# Patient Record
Sex: Female | Born: 1960 | Race: White | Hispanic: No | Marital: Married | Smoking: Never smoker
Health system: Southern US, Community
[De-identification: ages and names within clinical notes are randomized; demographics above are authoritative.]

## PROBLEM LIST (undated history)

## (undated) DIAGNOSIS — Z973 Presence of spectacles and contact lenses: Secondary | ICD-10-CM

## (undated) DIAGNOSIS — I471 Supraventricular tachycardia, unspecified: Secondary | ICD-10-CM

## (undated) DIAGNOSIS — D649 Anemia, unspecified: Secondary | ICD-10-CM

## (undated) DIAGNOSIS — H4312 Vitreous hemorrhage, left eye: Secondary | ICD-10-CM

## (undated) DIAGNOSIS — C801 Malignant (primary) neoplasm, unspecified: Secondary | ICD-10-CM

## (undated) HISTORY — PX: ABLATION: SHX5711

## (undated) HISTORY — PX: CHOLECYSTECTOMY: SHX55

## (undated) HISTORY — PX: EYE SURGERY: SHX253

## (undated) HISTORY — PX: COLONOSCOPY W/ BIOPSIES AND POLYPECTOMY: SHX1376

## (undated) HISTORY — PX: CARDIAC CATHETERIZATION: SHX172

---

## 1983-04-08 DIAGNOSIS — C801 Malignant (primary) neoplasm, unspecified: Secondary | ICD-10-CM

## 1983-04-08 DIAGNOSIS — C439 Malignant melanoma of skin, unspecified: Secondary | ICD-10-CM

## 1983-04-08 HISTORY — DX: Malignant (primary) neoplasm, unspecified: C80.1

## 1983-04-08 HISTORY — DX: Malignant melanoma of skin, unspecified: C43.9

## 2000-03-07 HISTORY — PX: BREAST CYST ASPIRATION: SHX578

## 2003-07-21 ENCOUNTER — Other Ambulatory Visit: Payer: Self-pay

## 2004-04-04 ENCOUNTER — Ambulatory Visit: Payer: Self-pay | Admitting: Obstetrics and Gynecology

## 2005-04-18 ENCOUNTER — Ambulatory Visit: Payer: Self-pay | Admitting: Obstetrics and Gynecology

## 2005-08-27 ENCOUNTER — Ambulatory Visit: Payer: Self-pay | Admitting: Unknown Physician Specialty

## 2006-04-20 ENCOUNTER — Ambulatory Visit: Payer: Self-pay | Admitting: Obstetrics and Gynecology

## 2007-01-01 ENCOUNTER — Ambulatory Visit: Payer: Self-pay | Admitting: Dermatology

## 2007-06-17 ENCOUNTER — Ambulatory Visit: Payer: Self-pay | Admitting: Obstetrics and Gynecology

## 2007-10-06 DIAGNOSIS — Z86018 Personal history of other benign neoplasm: Secondary | ICD-10-CM

## 2007-10-06 HISTORY — DX: Personal history of other benign neoplasm: Z86.018

## 2007-12-08 ENCOUNTER — Ambulatory Visit: Payer: Self-pay | Admitting: Obstetrics and Gynecology

## 2008-09-29 ENCOUNTER — Ambulatory Visit: Payer: Self-pay | Admitting: Obstetrics and Gynecology

## 2008-10-05 ENCOUNTER — Ambulatory Visit: Payer: Self-pay | Admitting: Dermatology

## 2008-10-31 ENCOUNTER — Ambulatory Visit: Payer: Self-pay | Admitting: Unknown Physician Specialty

## 2009-10-02 ENCOUNTER — Ambulatory Visit: Payer: Self-pay | Admitting: Obstetrics and Gynecology

## 2010-10-08 ENCOUNTER — Ambulatory Visit: Payer: Self-pay | Admitting: Obstetrics and Gynecology

## 2010-10-10 ENCOUNTER — Ambulatory Visit: Payer: Self-pay | Admitting: Obstetrics and Gynecology

## 2011-05-20 ENCOUNTER — Ambulatory Visit: Payer: Self-pay | Admitting: Internal Medicine

## 2011-11-14 ENCOUNTER — Ambulatory Visit: Payer: Self-pay | Admitting: Obstetrics and Gynecology

## 2011-11-18 ENCOUNTER — Ambulatory Visit: Payer: Self-pay | Admitting: Obstetrics and Gynecology

## 2011-12-11 ENCOUNTER — Ambulatory Visit: Payer: Self-pay | Admitting: Surgery

## 2011-12-11 HISTORY — PX: BREAST BIOPSY: SHX20

## 2011-12-12 LAB — PATHOLOGY REPORT

## 2012-06-10 ENCOUNTER — Ambulatory Visit: Payer: Self-pay | Admitting: Surgery

## 2012-12-15 ENCOUNTER — Ambulatory Visit: Payer: Self-pay | Admitting: Obstetrics and Gynecology

## 2013-12-19 ENCOUNTER — Ambulatory Visit: Payer: Self-pay | Admitting: Obstetrics and Gynecology

## 2014-11-15 ENCOUNTER — Other Ambulatory Visit: Payer: Self-pay | Admitting: Internal Medicine

## 2014-11-15 ENCOUNTER — Other Ambulatory Visit: Payer: Self-pay | Admitting: Obstetrics and Gynecology

## 2014-11-15 DIAGNOSIS — Z1231 Encounter for screening mammogram for malignant neoplasm of breast: Secondary | ICD-10-CM

## 2015-01-05 ENCOUNTER — Ambulatory Visit
Admission: RE | Admit: 2015-01-05 | Discharge: 2015-01-05 | Disposition: A | Discharge status: Home / Self Care | Payer: BLUE CROSS/BLUE SHIELD | Source: Ambulatory Visit | Attending: Obstetrics and Gynecology | Admitting: Obstetrics and Gynecology

## 2015-01-05 DIAGNOSIS — Z1231 Encounter for screening mammogram for malignant neoplasm of breast: Secondary | ICD-10-CM | POA: Insufficient documentation

## 2015-01-05 HISTORY — DX: Malignant (primary) neoplasm, unspecified: C80.1

## 2015-05-04 ENCOUNTER — Encounter (INDEPENDENT_AMBULATORY_CARE_PROVIDER_SITE_OTHER): Payer: Self-pay | Admitting: Ophthalmology

## 2015-05-11 ENCOUNTER — Encounter (INDEPENDENT_AMBULATORY_CARE_PROVIDER_SITE_OTHER): Payer: BLUE CROSS/BLUE SHIELD | Admitting: Ophthalmology

## 2015-05-11 DIAGNOSIS — H35033 Hypertensive retinopathy, bilateral: Secondary | ICD-10-CM | POA: Diagnosis not present

## 2015-05-11 DIAGNOSIS — H43813 Vitreous degeneration, bilateral: Secondary | ICD-10-CM

## 2015-05-11 DIAGNOSIS — I10 Essential (primary) hypertension: Secondary | ICD-10-CM

## 2015-05-11 DIAGNOSIS — H2512 Age-related nuclear cataract, left eye: Secondary | ICD-10-CM

## 2015-11-28 ENCOUNTER — Other Ambulatory Visit: Payer: Self-pay | Admitting: Obstetrics and Gynecology

## 2015-11-28 DIAGNOSIS — Z1231 Encounter for screening mammogram for malignant neoplasm of breast: Secondary | ICD-10-CM

## 2016-01-08 ENCOUNTER — Ambulatory Visit
Admission: RE | Admit: 2016-01-08 | Discharge: 2016-01-08 | Disposition: A | Discharge status: Home / Self Care | Payer: BLUE CROSS/BLUE SHIELD | Source: Ambulatory Visit | Attending: Obstetrics and Gynecology | Admitting: Obstetrics and Gynecology

## 2016-01-08 DIAGNOSIS — Z1231 Encounter for screening mammogram for malignant neoplasm of breast: Secondary | ICD-10-CM | POA: Diagnosis present

## 2016-03-26 ENCOUNTER — Encounter (INDEPENDENT_AMBULATORY_CARE_PROVIDER_SITE_OTHER): Payer: BLUE CROSS/BLUE SHIELD | Admitting: Ophthalmology

## 2016-03-26 DIAGNOSIS — H33021 Retinal detachment with multiple breaks, right eye: Secondary | ICD-10-CM

## 2016-03-26 DIAGNOSIS — I10 Essential (primary) hypertension: Secondary | ICD-10-CM

## 2016-03-26 DIAGNOSIS — H2512 Age-related nuclear cataract, left eye: Secondary | ICD-10-CM | POA: Diagnosis not present

## 2016-03-26 DIAGNOSIS — H43813 Vitreous degeneration, bilateral: Secondary | ICD-10-CM | POA: Diagnosis not present

## 2016-03-26 DIAGNOSIS — H35033 Hypertensive retinopathy, bilateral: Secondary | ICD-10-CM

## 2016-04-10 ENCOUNTER — Encounter (INDEPENDENT_AMBULATORY_CARE_PROVIDER_SITE_OTHER): Payer: BLUE CROSS/BLUE SHIELD | Admitting: Ophthalmology

## 2016-04-10 DIAGNOSIS — H33301 Unspecified retinal break, right eye: Secondary | ICD-10-CM

## 2016-08-11 ENCOUNTER — Ambulatory Visit (INDEPENDENT_AMBULATORY_CARE_PROVIDER_SITE_OTHER): Payer: BLUE CROSS/BLUE SHIELD | Admitting: Ophthalmology

## 2016-08-11 DIAGNOSIS — H33301 Unspecified retinal break, right eye: Secondary | ICD-10-CM

## 2016-08-11 DIAGNOSIS — I10 Essential (primary) hypertension: Secondary | ICD-10-CM | POA: Diagnosis not present

## 2016-08-11 DIAGNOSIS — H43813 Vitreous degeneration, bilateral: Secondary | ICD-10-CM | POA: Diagnosis not present

## 2016-08-11 DIAGNOSIS — H35033 Hypertensive retinopathy, bilateral: Secondary | ICD-10-CM

## 2016-08-11 DIAGNOSIS — H2513 Age-related nuclear cataract, bilateral: Secondary | ICD-10-CM | POA: Diagnosis not present

## 2016-11-28 ENCOUNTER — Other Ambulatory Visit: Payer: Self-pay | Admitting: Obstetrics and Gynecology

## 2017-01-14 ENCOUNTER — Other Ambulatory Visit: Payer: Self-pay | Admitting: Obstetrics and Gynecology

## 2017-01-14 DIAGNOSIS — Z1231 Encounter for screening mammogram for malignant neoplasm of breast: Secondary | ICD-10-CM

## 2017-02-05 ENCOUNTER — Ambulatory Visit
Admission: RE | Admit: 2017-02-05 | Discharge: 2017-02-05 | Disposition: A | Discharge status: Home / Self Care | Payer: BLUE CROSS/BLUE SHIELD | Source: Ambulatory Visit | Attending: Obstetrics and Gynecology | Admitting: Obstetrics and Gynecology

## 2017-02-05 DIAGNOSIS — Z1231 Encounter for screening mammogram for malignant neoplasm of breast: Secondary | ICD-10-CM | POA: Insufficient documentation

## 2017-09-18 ENCOUNTER — Other Ambulatory Visit: Payer: Self-pay

## 2017-09-18 ENCOUNTER — Emergency Department
Admission: EM | Admit: 2017-09-18 | Discharge: 2017-09-19 | Disposition: A | Discharge status: Home / Self Care | Payer: BLUE CROSS/BLUE SHIELD | Attending: Emergency Medicine | Admitting: Emergency Medicine

## 2017-09-18 DIAGNOSIS — K2100 Gastro-esophageal reflux disease with esophagitis, without bleeding: Secondary | ICD-10-CM

## 2017-09-18 DIAGNOSIS — R110 Nausea: Secondary | ICD-10-CM | POA: Diagnosis present

## 2017-09-18 LAB — URINALYSIS, COMPLETE (UACMP) WITH MICROSCOPIC
Bacteria, UA: NONE SEEN
Bilirubin Urine: NEGATIVE
GLUCOSE, UA: NEGATIVE mg/dL
Hgb urine dipstick: NEGATIVE
Ketones, ur: NEGATIVE mg/dL
Nitrite: NEGATIVE
PH: 5 (ref 5.0–8.0)
Protein, ur: NEGATIVE mg/dL
Specific Gravity, Urine: 1.014 (ref 1.005–1.030)

## 2017-09-18 LAB — CBC
HCT: 39.3 % (ref 35.0–47.0)
Hemoglobin: 13.5 g/dL (ref 12.0–16.0)
MCH: 31.1 pg (ref 26.0–34.0)
MCHC: 34.4 g/dL (ref 32.0–36.0)
MCV: 90.4 fL (ref 80.0–100.0)
PLATELETS: 185 10*3/uL (ref 150–440)
RBC: 4.34 MIL/uL (ref 3.80–5.20)
RDW: 13 % (ref 11.5–14.5)
WBC: 9.7 10*3/uL (ref 3.6–11.0)

## 2017-09-18 LAB — COMPREHENSIVE METABOLIC PANEL
ALK PHOS: 63 U/L (ref 38–126)
ALT: 15 U/L (ref 14–54)
AST: 25 U/L (ref 15–41)
Albumin: 3.9 g/dL (ref 3.5–5.0)
Anion gap: 8 (ref 5–15)
BUN: 16 mg/dL (ref 6–20)
CALCIUM: 8.6 mg/dL — AB (ref 8.9–10.3)
CHLORIDE: 102 mmol/L (ref 101–111)
CO2: 26 mmol/L (ref 22–32)
CREATININE: 0.89 mg/dL (ref 0.44–1.00)
GFR calc Af Amer: >60 mL/min (ref >60)
Glucose, Bld: 130 mg/dL — ABNORMAL HIGH (ref 65–99)
Potassium: 3.4 mmol/L — ABNORMAL LOW (ref 3.5–5.1)
Sodium: 136 mmol/L (ref 135–145)
Total Bilirubin: 0.4 mg/dL (ref 0.3–1.2)
Total Protein: 7 g/dL (ref 6.5–8.1)

## 2017-09-18 LAB — TROPONIN I: Troponin I: <0.03 ng/mL (ref <0.03)

## 2017-09-18 LAB — LIPASE, BLOOD: LIPASE: 40 U/L (ref 11–51)

## 2017-09-18 NOTE — ED Provider Notes (Signed)
[facility] Emergency Department Provider Note   ____________________________________________   First MD Initiated Contact with Patient 09/18/17 2348     (approximate)  I have reviewed the triage vital signs and the nursing notes.   HISTORY  Chief Complaint Nausea and Gastroesophageal Reflux    HPI Tina Wallace is a 57 y.o. female who presents to the ED from home with a chief complaint of nausea and burning sensation from the sternum up into her throat.  Symptoms ongoing since noon.  States the symptoms were not too bad at lunchtime but worsened at dinner.  Patient states she ate relatively light (grilled chicken and sweet potato).  Takes ranitidine daily.  Occasional nausea today.  Denies associated fever, chills, diaphoresis, shortness of breath, abdominal pain, nausea, vomiting, diarrhea.  Denies recent travel, trauma or hormone use.   Past Medical History:  Diagnosis Date  . Cancer ([place]) 1985   melanoma    There are no active problems to display for this patient.   Past Surgical History:  Procedure Laterality Date  . BREAST BIOPSY Left 12/11/11   bx/clip-neg  . BREAST CYST ASPIRATION Right 03/07/00   neg    Prior to Admission medications   Medication Sig Start Date End Date Taking? Authorizing Provider  pantoprazole (PROTONIX) 40 MG tablet Take 1 tablet (40 mg total) by mouth daily. 09/19/17   Paulette Blanch, MD  sucralfate (CARAFATE) 1 GM/10ML suspension Take 10 mLs (1 g total) by mouth 4 (four) times daily. 09/19/17   Paulette Blanch, MD    Allergies Codeine; Percocet [oxycodone-acetaminophen]; and Sulfa antibiotics  No family history on file.  Social History Social History   Tobacco Use  . Smoking status: Never Smoker  . Smokeless tobacco: Never Used  Substance Use Topics  . Alcohol use: Not on file  . Drug use: Not on file    Review of Systems  Constitutional: No fever/chills Eyes: No visual changes. ENT: Positive for  burning sensation from sternum to throat.  No sore throat. Cardiovascular: Denies chest pain. Respiratory: Denies shortness of breath. Gastrointestinal: No abdominal pain.  Positive for nausea, no vomiting.  No diarrhea.  No constipation. Genitourinary: Negative for dysuria. Musculoskeletal: Negative for back pain. Skin: Negative for rash. Neurological: Negative for headaches, focal weakness or numbness.   ____________________________________________   PHYSICAL EXAM:  VITAL SIGNS: ED Triage Vitals  Enc Vitals Group     BP 09/18/17 2111 (!) 153/80     Pulse Rate 09/18/17 2111 84     Resp 09/18/17 2111 16     Temp 09/18/17 2111 98.2 F (36.8 C)     Temp Source 09/18/17 2111 Oral     SpO2 09/18/17 2111 100 %     Weight 09/18/17 2112 138 lb (62.6 kg)     Height 09/18/17 2112 5\' 3"  (1.6 m)     Head Circumference --      Peak Flow --      Pain Score 09/18/17 2112 6     Pain Loc --      Pain Edu? --      Excl. in [place]? --     Constitutional: Alert and oriented. Well appearing and in no acute distress. Eyes: Conjunctivae are normal. PERRL. EOMI. Head: Atraumatic. Nose: No congestion/rhinnorhea. Mouth/Throat: Mucous membranes are moist.  Oropharynx non-erythematous. Neck: No stridor.   Cardiovascular: Normal rate, regular rhythm. Grossly normal heart sounds.  Good peripheral circulation. Respiratory: Normal respiratory effort.  No retractions. Lungs CTAB.  Gastrointestinal: Soft and nontender to light or deep palpation. No distention. No abdominal bruits. No CVA tenderness. Musculoskeletal: No lower extremity tenderness nor edema.  No joint effusions. Neurologic:  Normal speech and language. No gross focal neurologic deficits are appreciated. No gait instability. Skin:  Skin is warm, dry and intact. No rash noted. Psychiatric: Mood and affect are normal. Speech and behavior are normal.  ____________________________________________   LABS (all labs ordered are listed, but only  abnormal results are displayed)  Labs Reviewed  COMPREHENSIVE METABOLIC PANEL - Abnormal; Notable for the following components:      Result Value   Potassium 3.4 (*)    Glucose, Bld 130 (*)    Calcium 8.6 (*)    All other components within normal limits  URINALYSIS, COMPLETE (UACMP) WITH MICROSCOPIC - Abnormal; Notable for the following components:   Color, Urine YELLOW (*)    APPearance CLEAR (*)    Leukocytes, UA TRACE (*)    All other components within normal limits  LIPASE, BLOOD  CBC  TROPONIN I   ____________________________________________  EKG  ED ECG REPORT I, Lametria Klunk J, the attending physician, personally viewed and interpreted this ECG.   Date: 09/19/2017  EKG Time: 2108  Rate: 82  Rhythm: normal EKG, normal sinus rhythm  Axis: Normal  Intervals:none  ST&T Change: Nonspecific  ____________________________________________  RADIOLOGY  ED MD interpretation: None  Official radiology report(s): No results found.  ____________________________________________   PROCEDURES  Procedure(s) performed: None  Procedures  Critical Care performed: No  ____________________________________________   INITIAL IMPRESSION / ASSESSMENT AND PLAN / ED COURSE  As part of my medical decision making, I reviewed the following data within the electronic MEDICAL RECORD NUMBER History obtained from family, Nursing notes reviewed and incorporated, Labs reviewed, EKG interpreted, Old chart reviewed and Notes from prior ED visits   57 year old relatively healthy female who presents to the ED with burning type sensation from her sternum to her throat since noon. Differential diagnosis includes, but is not limited to, biliary disease (biliary colic, acute cholecystitis, cholangitis, choledocholithiasis, etc), intrathoracic causes for epigastric abdominal pain including ACS, gastritis, duodenitis, pancreatitis, small bowel or large bowel obstruction, abdominal aortic aneurysm, hernia,  and ulcer(s).  Laboratory and urinalysis results unremarkable.  Patient's PCP called several hours ago to order a GI cocktail.  Patient and spouse are frustrated at the long ED wait.  Patient just wants a GI cocktail and declines repeat troponin and chest x-ray.  I think this is reasonable in light of patient's symptoms and work-up suggesting GI rather than cardiac source.  We will also prescribe Protonix and Carafate for patient to take along with her daily ranitidine.  Strict return precautions given.  Both verbalize understanding and agree with plan of care.      ____________________________________________   FINAL CLINICAL IMPRESSION(S) / ED DIAGNOSES  Final diagnoses:  Gastroesophageal reflux disease with esophagitis     ED Discharge Orders        Ordered    pantoprazole (PROTONIX) 40 MG tablet  Daily     09/19/17 0021    sucralfate (CARAFATE) 1 GM/10ML suspension  4 times daily     09/19/17 0021       Note:  This document was prepared using Dragon voice recognition software and may include unintentional dictation errors.    Paulette Blanch, MD 09/19/17 [phone]

## 2017-09-18 NOTE — ED Notes (Signed)
Dr. Everlene Balls called prior to patient arrival ans states that he thinks that she may have heartburn and would like for her to have a GI Cocktail.

## 2017-09-18 NOTE — ED Triage Notes (Signed)
Pt arrives to ED via POV from home with c/o nausea and "burning" from the sternum up her throat since noon today. Pt states the discomfort "wasn't too bad at lunchtime, but at dinner got worse". Pt states she has a h/x of "too much acid in my stomach", but denies h/x of acid reflux. Pt denies any c/o SHOB, no N/V/D, no ABD pain. Pt is calm, A&Ox4, in NAD, with RR even, regular, and unlabored.

## 2017-09-19 MED ORDER — SUCRALFATE 1 GM/10ML PO SUSP: 1.0000 g | Freq: Four times a day (QID) | ORAL | 1 refills | Status: DC

## 2017-09-19 MED ORDER — GI COCKTAIL ~~LOC~~
30.0000 mL | Freq: Once | ORAL | Status: AC
  Administered 2017-09-19: 30 mL via ORAL
  Filled 2017-09-19: qty 30

## 2017-09-19 MED ORDER — PANTOPRAZOLE SODIUM 40 MG PO TBEC: 40.0000 mg | DELAYED_RELEASE_TABLET | Freq: Every day | ORAL | 0 refills | Status: DC

## 2017-09-19 NOTE — ED Notes (Signed)
ED Provider at bedside. 

## 2017-09-19 NOTE — Discharge Instructions (Addendum)
1.  You may take these medicines in addition to the ranitidine you already take: Protonix 40 mg daily Carafate 4 times daily 2.  Eat a bland diet until seen by your doctor.  Avoid heavy, greasy, spicy foods and alcohol. 3.  Return to the ER for worsening symptoms, persistent vomiting, difficulty breathing or other concerns.

## 2018-01-07 ENCOUNTER — Other Ambulatory Visit: Payer: Self-pay | Admitting: Obstetrics and Gynecology

## 2018-01-07 DIAGNOSIS — Z1231 Encounter for screening mammogram for malignant neoplasm of breast: Secondary | ICD-10-CM

## 2018-02-27 ENCOUNTER — Encounter (INDEPENDENT_AMBULATORY_CARE_PROVIDER_SITE_OTHER): Payer: BLUE CROSS/BLUE SHIELD | Admitting: Ophthalmology

## 2018-02-27 DIAGNOSIS — H33302 Unspecified retinal break, left eye: Secondary | ICD-10-CM

## 2018-03-10 ENCOUNTER — Encounter (INDEPENDENT_AMBULATORY_CARE_PROVIDER_SITE_OTHER): Payer: BLUE CROSS/BLUE SHIELD | Admitting: Ophthalmology

## 2018-03-10 DIAGNOSIS — H33302 Unspecified retinal break, left eye: Secondary | ICD-10-CM

## 2018-03-12 ENCOUNTER — Ambulatory Visit
Admission: RE | Admit: 2018-03-12 | Discharge: 2018-03-12 | Disposition: A | Discharge status: Home / Self Care | Payer: BLUE CROSS/BLUE SHIELD | Source: Ambulatory Visit | Attending: Obstetrics and Gynecology | Admitting: Obstetrics and Gynecology

## 2018-03-12 DIAGNOSIS — Z1231 Encounter for screening mammogram for malignant neoplasm of breast: Secondary | ICD-10-CM | POA: Diagnosis present

## 2018-03-16 ENCOUNTER — Encounter (INDEPENDENT_AMBULATORY_CARE_PROVIDER_SITE_OTHER): Payer: BLUE CROSS/BLUE SHIELD | Admitting: Ophthalmology

## 2018-03-16 DIAGNOSIS — H4312 Vitreous hemorrhage, left eye: Secondary | ICD-10-CM | POA: Diagnosis not present

## 2018-03-17 NOTE — H&P (Signed)
Tina Wallace is an 57 y.o. female.   Chief Complaint: sudden floaters and severe loss of vision left eye HPI: Had repair of break OD 2017. Sudden floaters two weeks ago. Laser for break OS two weeks ago.  Now sudden vitreous hemorrhage and loss of vision.left eye  Past Medical History:  Diagnosis Date  . Cancer ([place]) 1985   melanoma    Past Surgical History:  Procedure Laterality Date  . BREAST BIOPSY Left 12/11/11   bx/clip-neg  . BREAST CYST ASPIRATION Right 03/07/00   neg    No family history on file. Social History:  reports that she has never smoked. She has never used smokeless tobacco. Her alcohol and drug histories are not on file.  Allergies:  Allergies  Allergen Reactions  . Codeine Nausea And Vomiting  . Percocet [Oxycodone-Acetaminophen] Rash    No medications prior to admission.    Review of systems otherwise negative  There were no vitals taken for this visit.  Physical exam: Mental status: oriented x3. Eyes: See eye exam associated with this date of surgery in media tab.  Scanned in by scanning center Ears, Nose, Throat: within normal limits Neck: Within Normal limits General: within normal limits Chest: Within normal limits Breast: deferred Heart: Within normal limits Abdomen: Within normal limits GU: deferred Extremities: within normal limits Skin: within normal limits  Assessment/Plan Vitreous hemorrhage with retinal break Plan: To [facility] for Pars plana vitrectomy, laser, gas injection left eye  Hayden Pedro 03/17/2018, 4:54 PM

## 2018-03-22 ENCOUNTER — Encounter (HOSPITAL_COMMUNITY): Payer: Self-pay | Admitting: *Deleted

## 2018-03-22 ENCOUNTER — Other Ambulatory Visit: Payer: Self-pay

## 2018-03-22 NOTE — Progress Notes (Signed)
Pt denies SOB and chest pain. Pt under the care of Dr. Nehemiah Massed, Cardiology. Pt denies having a chest x ray within the last year. Pt denies recent labs. Pt stated that she does not take Aspirin and NSAIDs. Pt made aware to stop taking vitamins, fish oil and herbal medications. Pt verbalized understanding of all pre-op instructions.

## 2018-03-23 ENCOUNTER — Encounter (HOSPITAL_COMMUNITY)
Admission: RE | Disposition: A | Discharge status: Home / Self Care | Payer: Self-pay | Source: Home / Self Care | Attending: Ophthalmology

## 2018-03-23 ENCOUNTER — Ambulatory Visit (HOSPITAL_COMMUNITY): Payer: BLUE CROSS/BLUE SHIELD | Admitting: Anesthesiology

## 2018-03-23 ENCOUNTER — Encounter (INDEPENDENT_AMBULATORY_CARE_PROVIDER_SITE_OTHER): Payer: BLUE CROSS/BLUE SHIELD | Admitting: Ophthalmology

## 2018-03-23 ENCOUNTER — Encounter (HOSPITAL_COMMUNITY): Payer: Self-pay

## 2018-03-23 ENCOUNTER — Ambulatory Visit (HOSPITAL_COMMUNITY)
Admission: RE | Admit: 2018-03-23 | Discharge: 2018-03-24 | Disposition: A | Discharge status: Home / Self Care | Payer: BLUE CROSS/BLUE SHIELD | Attending: Ophthalmology | Admitting: Ophthalmology

## 2018-03-23 DIAGNOSIS — Z885 Allergy status to narcotic agent status: Secondary | ICD-10-CM | POA: Diagnosis not present

## 2018-03-23 DIAGNOSIS — H4312 Vitreous hemorrhage, left eye: Secondary | ICD-10-CM | POA: Insufficient documentation

## 2018-03-23 DIAGNOSIS — H33302 Unspecified retinal break, left eye: Secondary | ICD-10-CM | POA: Insufficient documentation

## 2018-03-23 DIAGNOSIS — Z79899 Other long term (current) drug therapy: Secondary | ICD-10-CM | POA: Insufficient documentation

## 2018-03-23 DIAGNOSIS — Z85820 Personal history of malignant melanoma of skin: Secondary | ICD-10-CM | POA: Insufficient documentation

## 2018-03-23 DIAGNOSIS — I10 Essential (primary) hypertension: Secondary | ICD-10-CM | POA: Diagnosis not present

## 2018-03-23 HISTORY — DX: Vitreous hemorrhage, left eye: H43.12

## 2018-03-23 HISTORY — PX: GAS/FLUID EXCHANGE: SHX5334

## 2018-03-23 HISTORY — DX: Supraventricular tachycardia, unspecified: I47.10

## 2018-03-23 HISTORY — PX: LASER PHOTO ABLATION: SHX5942

## 2018-03-23 HISTORY — DX: Presence of spectacles and contact lenses: Z97.3

## 2018-03-23 HISTORY — PX: PARS PLANA VITRECTOMY 27 GAUGE: SHX6738

## 2018-03-23 HISTORY — DX: Anemia, unspecified: D64.9

## 2018-03-23 HISTORY — DX: Supraventricular tachycardia: I47.1

## 2018-03-23 LAB — CBC
HEMATOCRIT: 41 % (ref 36.0–46.0)
Hemoglobin: 13 g/dL (ref 12.0–15.0)
MCH: 29.1 pg (ref 26.0–34.0)
MCHC: 31.7 g/dL (ref 30.0–36.0)
MCV: 91.7 fL (ref 80.0–100.0)
Platelets: 173 10*3/uL (ref 150–400)
RBC: 4.47 MIL/uL (ref 3.87–5.11)
RDW: 11.9 % (ref 11.5–15.5)
WBC: 5.4 10*3/uL (ref 4.0–10.5)
nRBC: 0 % (ref 0.0–0.2)

## 2018-03-23 SURGERY — PARS PLANA VITRECTOMY 27 GAUGE
Anesthesia: General | Site: Eye | Laterality: Left

## 2018-03-23 MED ORDER — BUPIVACAINE HCL (PF) 0.75 % IJ SOLN
INTRAMUSCULAR | Status: DC | PRN
  Administered 2018-03-23: 10 mL

## 2018-03-23 MED ORDER — DEXAMETHASONE SODIUM PHOSPHATE 10 MG/ML IJ SOLN
INTRAMUSCULAR | Status: DC | PRN
  Administered 2018-03-23: 10 mg via INTRAVENOUS

## 2018-03-23 MED ORDER — MIDAZOLAM HCL 5 MG/5ML IJ SOLN
INTRAMUSCULAR | Status: DC | PRN
  Administered 2018-03-23: 2 mg via INTRAVENOUS

## 2018-03-23 MED ORDER — CEFAZOLIN SODIUM-DEXTROSE 2-4 GM/100ML-% IV SOLN
2.0000 g | INTRAVENOUS | Status: AC
  Administered 2018-03-23: 2 g via INTRAVENOUS
  Filled 2018-03-23: qty 100

## 2018-03-23 MED ORDER — FAMOTIDINE 20 MG PO TABS
20.0000 mg | ORAL_TABLET | Freq: Every day | ORAL | Status: DC
  Administered 2018-03-23: 20 mg via ORAL
  Filled 2018-03-23: qty 1

## 2018-03-23 MED ORDER — HEMOSTATIC AGENTS (NO CHARGE) OPTIME
TOPICAL | Status: DC | PRN
  Administered 2018-03-23: 1 via TOPICAL

## 2018-03-23 MED ORDER — ACETAMINOPHEN 160 MG/5ML PO SOLN: 1000.0000 mg | Freq: Once | ORAL | Status: DC | PRN

## 2018-03-23 MED ORDER — METOPROLOL SUCCINATE ER 25 MG PO TB24
25.0000 mg | ORAL_TABLET | Freq: Every day | ORAL | Status: DC
  Filled 2018-03-23: qty 1

## 2018-03-23 MED ORDER — BACITRACIN-POLYMYXIN B 500-10000 UNIT/GM OP OINT
1.0000 "application " | TOPICAL_OINTMENT | Freq: Three times a day (TID) | OPHTHALMIC | Status: DC
  Filled 2018-03-23: qty 3.5

## 2018-03-23 MED ORDER — CEFTAZIDIME 1 G IJ SOLR
INTRAMUSCULAR | Status: AC
  Filled 2018-03-23: qty 1

## 2018-03-23 MED ORDER — BSS IO SOLN
INTRAOCULAR | Status: AC
  Filled 2018-03-23: qty 15

## 2018-03-23 MED ORDER — EPINEPHRINE PF 1 MG/ML IJ SOLN
INTRAMUSCULAR | Status: AC
  Filled 2018-03-23: qty 1

## 2018-03-23 MED ORDER — SODIUM HYALURONATE 10 MG/ML IO SOLN
INTRAOCULAR | Status: DC | PRN
  Administered 2018-03-23: 0.85 mL via INTRAOCULAR

## 2018-03-23 MED ORDER — PROPOFOL 10 MG/ML IV BOLUS
INTRAVENOUS | Status: AC
  Filled 2018-03-23: qty 20

## 2018-03-23 MED ORDER — POLYMYXIN B SULFATE 500000 UNITS IJ SOLR
INTRAMUSCULAR | Status: AC
  Filled 2018-03-23: qty 500000

## 2018-03-23 MED ORDER — STERILE WATER FOR INJECTION IJ SOLN
INTRAMUSCULAR | Status: DC | PRN
  Administered 2018-03-23: 20 mL

## 2018-03-23 MED ORDER — SUGAMMADEX SODIUM 200 MG/2ML IV SOLN
INTRAVENOUS | Status: DC | PRN
  Administered 2018-03-23: 150 mg via INTRAVENOUS

## 2018-03-23 MED ORDER — ONDANSETRON HCL 4 MG/2ML IJ SOLN
INTRAMUSCULAR | Status: AC
  Filled 2018-03-23: qty 2

## 2018-03-23 MED ORDER — TEMAZEPAM 15 MG PO CAPS: 15.0000 mg | ORAL_CAPSULE | Freq: Every evening | ORAL | Status: DC | PRN

## 2018-03-23 MED ORDER — DEXAMETHASONE SODIUM PHOSPHATE 10 MG/ML IJ SOLN
INTRAMUSCULAR | Status: AC
  Filled 2018-03-23: qty 1

## 2018-03-23 MED ORDER — HYDROCODONE-ACETAMINOPHEN 7.5-325 MG PO TABS: 1.0000 | ORAL_TABLET | Freq: Once | ORAL | Status: DC | PRN

## 2018-03-23 MED ORDER — DEXAMETHASONE SODIUM PHOSPHATE 10 MG/ML IJ SOLN
INTRAMUSCULAR | Status: DC | PRN
  Administered 2018-03-23: 10 mg

## 2018-03-23 MED ORDER — SODIUM CHLORIDE 0.45 % IV SOLN
INTRAVENOUS | Status: DC
  Administered 2018-03-23: 17:00:00 via INTRAVENOUS

## 2018-03-23 MED ORDER — BSS PLUS IO SOLN
INTRAOCULAR | Status: AC
  Filled 2018-03-23: qty 500

## 2018-03-23 MED ORDER — CYCLOPENTOLATE HCL 1 % OP SOLN
1.0000 [drp] | OPHTHALMIC | Status: AC | PRN
  Administered 2018-03-23 (×3): 1 [drp] via OPHTHALMIC
  Filled 2018-03-23: qty 2

## 2018-03-23 MED ORDER — STERILE WATER FOR IRRIGATION IR SOLN
Status: DC | PRN
  Administered 2018-03-23: 200 mL

## 2018-03-23 MED ORDER — LIDOCAINE 2% (20 MG/ML) 5 ML SYRINGE
INTRAMUSCULAR | Status: AC
  Filled 2018-03-23: qty 5

## 2018-03-23 MED ORDER — LATANOPROST 0.005 % OP SOLN
1.0000 [drp] | Freq: Every day | OPHTHALMIC | Status: DC
  Filled 2018-03-23: qty 2.5

## 2018-03-23 MED ORDER — ROCURONIUM BROMIDE 10 MG/ML (PF) SYRINGE
PREFILLED_SYRINGE | INTRAVENOUS | Status: DC | PRN
  Administered 2018-03-23: 40 mg via INTRAVENOUS

## 2018-03-23 MED ORDER — PHENYLEPHRINE HCL 2.5 % OP SOLN
1.0000 [drp] | OPHTHALMIC | Status: AC | PRN
  Administered 2018-03-23 (×3): 1 [drp] via OPHTHALMIC
  Filled 2018-03-23: qty 2

## 2018-03-23 MED ORDER — METOPROLOL SUCCINATE ER 25 MG PO TB24
25.0000 mg | ORAL_TABLET | Freq: Every day | ORAL | Status: DC
  Administered 2018-03-23: 25 mg via ORAL
  Filled 2018-03-23: qty 1

## 2018-03-23 MED ORDER — ATROPINE SULFATE 1 % OP SOLN
OPHTHALMIC | Status: AC
  Filled 2018-03-23: qty 5

## 2018-03-23 MED ORDER — GATIFLOXACIN 0.5 % OP SOLN
1.0000 [drp] | Freq: Four times a day (QID) | OPHTHALMIC | Status: DC
  Filled 2018-03-23: qty 2.5

## 2018-03-23 MED ORDER — BSS IO SOLN
INTRAOCULAR | Status: DC | PRN
  Administered 2018-03-23: 15 mL via INTRAOCULAR

## 2018-03-23 MED ORDER — FENTANYL CITRATE (PF) 250 MCG/5ML IJ SOLN
INTRAMUSCULAR | Status: AC
  Filled 2018-03-23: qty 5

## 2018-03-23 MED ORDER — DORZOLAMIDE HCL 2 % OP SOLN
1.0000 [drp] | Freq: Three times a day (TID) | OPHTHALMIC | Status: DC
  Filled 2018-03-23: qty 10

## 2018-03-23 MED ORDER — EPHEDRINE SULFATE-NACL 50-0.9 MG/10ML-% IV SOSY
PREFILLED_SYRINGE | INTRAVENOUS | Status: DC | PRN
  Administered 2018-03-23: 10 mg via INTRAVENOUS

## 2018-03-23 MED ORDER — PREDNISOLONE ACETATE 1 % OP SUSP
1.0000 [drp] | Freq: Four times a day (QID) | OPHTHALMIC | Status: DC
  Filled 2018-03-23: qty 5

## 2018-03-23 MED ORDER — TROPICAMIDE 1 % OP SOLN
1.0000 [drp] | OPHTHALMIC | Status: AC | PRN
  Administered 2018-03-23 (×3): 1 [drp] via OPHTHALMIC
  Filled 2018-03-23: qty 15

## 2018-03-23 MED ORDER — EPHEDRINE 5 MG/ML INJ
INTRAVENOUS | Status: AC
  Filled 2018-03-23: qty 10

## 2018-03-23 MED ORDER — PROPOFOL 10 MG/ML IV BOLUS
INTRAVENOUS | Status: DC | PRN
  Administered 2018-03-23: 130 mg via INTRAVENOUS

## 2018-03-23 MED ORDER — TETRACAINE HCL 0.5 % OP SOLN
2.0000 [drp] | Freq: Once | OPHTHALMIC | Status: DC
  Filled 2018-03-23: qty 4

## 2018-03-23 MED ORDER — SODIUM HYALURONATE 10 MG/ML IO SOLN
INTRAOCULAR | Status: AC
  Filled 2018-03-23: qty 0.85

## 2018-03-23 MED ORDER — MIDAZOLAM HCL 2 MG/2ML IJ SOLN
INTRAMUSCULAR | Status: AC
  Filled 2018-03-23: qty 2

## 2018-03-23 MED ORDER — BRIMONIDINE TARTRATE 0.2 % OP SOLN
1.0000 [drp] | Freq: Two times a day (BID) | OPHTHALMIC | Status: DC
  Filled 2018-03-23: qty 5

## 2018-03-23 MED ORDER — LIDOCAINE 2% (20 MG/ML) 5 ML SYRINGE
INTRAMUSCULAR | Status: DC | PRN
  Administered 2018-03-23: 60 mg via INTRAVENOUS

## 2018-03-23 MED ORDER — ACETAMINOPHEN 500 MG PO TABS: 1000.0000 mg | ORAL_TABLET | Freq: Once | ORAL | Status: DC | PRN

## 2018-03-23 MED ORDER — BACITRACIN-POLYMYXIN B 500-10000 UNIT/GM OP OINT
TOPICAL_OINTMENT | OPHTHALMIC | Status: DC | PRN
  Administered 2018-03-23: 1 via OPHTHALMIC

## 2018-03-23 MED ORDER — SIMVASTATIN 20 MG PO TABS
10.0000 mg | ORAL_TABLET | Freq: Every day | ORAL | Status: DC
  Administered 2018-03-23: 10 mg via ORAL
  Filled 2018-03-23: qty 1

## 2018-03-23 MED ORDER — ONDANSETRON HCL 4 MG/2ML IJ SOLN
INTRAMUSCULAR | Status: DC | PRN
  Administered 2018-03-23: 4 mg via INTRAVENOUS

## 2018-03-23 MED ORDER — ACETAZOLAMIDE SODIUM 500 MG IJ SOLR
500.0000 mg | Freq: Once | INTRAMUSCULAR | Status: AC
  Administered 2018-03-24: 500 mg via INTRAVENOUS
  Filled 2018-03-23: qty 500

## 2018-03-23 MED ORDER — TRIAMCINOLONE ACETONIDE 40 MG/ML IJ SUSP
INTRAMUSCULAR | Status: AC
  Filled 2018-03-23: qty 5

## 2018-03-23 MED ORDER — BACITRACIN-POLYMYXIN B 500-10000 UNIT/GM OP OINT
TOPICAL_OINTMENT | OPHTHALMIC | Status: AC
  Filled 2018-03-23: qty 3.5

## 2018-03-23 MED ORDER — ONDANSETRON HCL 4 MG/2ML IJ SOLN
4.0000 mg | Freq: Four times a day (QID) | INTRAMUSCULAR | Status: DC
  Administered 2018-03-23 – 2018-03-24 (×3): 4 mg via INTRAVENOUS
  Filled 2018-03-23 (×3): qty 2

## 2018-03-23 MED ORDER — FENTANYL CITRATE (PF) 100 MCG/2ML IJ SOLN: 25.0000 ug | INTRAMUSCULAR | Status: DC | PRN

## 2018-03-23 MED ORDER — BUPIVACAINE HCL (PF) 0.75 % IJ SOLN
INTRAMUSCULAR | Status: AC
  Filled 2018-03-23: qty 10

## 2018-03-23 MED ORDER — FENTANYL CITRATE (PF) 100 MCG/2ML IJ SOLN
INTRAMUSCULAR | Status: DC | PRN
  Administered 2018-03-23: 100 ug via INTRAVENOUS

## 2018-03-23 MED ORDER — STERILE WATER FOR INJECTION IJ SOLN
INTRAMUSCULAR | Status: AC
  Filled 2018-03-23: qty 20

## 2018-03-23 MED ORDER — SODIUM CHLORIDE 0.9 % IV SOLN
INTRAVENOUS | Status: DC
  Administered 2018-03-23 (×2): via INTRAVENOUS

## 2018-03-23 MED ORDER — EPINEPHRINE PF 1 MG/ML IJ SOLN
INTRAOCULAR | Status: DC | PRN
  Administered 2018-03-23: 14:00:00

## 2018-03-23 MED ORDER — ACETAMINOPHEN 10 MG/ML IV SOLN: 1000.0000 mg | Freq: Once | INTRAVENOUS | Status: DC | PRN

## 2018-03-23 MED ORDER — 0.9 % SODIUM CHLORIDE (POUR BTL) OPTIME
TOPICAL | Status: DC | PRN
  Administered 2018-03-23: 200 mL

## 2018-03-23 MED ORDER — MAGNESIUM HYDROXIDE 400 MG/5ML PO SUSP: 15.0000 mL | Freq: Four times a day (QID) | ORAL | Status: DC | PRN

## 2018-03-23 MED ORDER — PHENYLEPHRINE 40 MCG/ML (10ML) SYRINGE FOR IV PUSH (FOR BLOOD PRESSURE SUPPORT)
PREFILLED_SYRINGE | INTRAVENOUS | Status: AC
  Filled 2018-03-23: qty 10

## 2018-03-23 MED ORDER — PHENYLEPHRINE 40 MCG/ML (10ML) SYRINGE FOR IV PUSH (FOR BLOOD PRESSURE SUPPORT)
PREFILLED_SYRINGE | INTRAVENOUS | Status: DC | PRN
  Administered 2018-03-23: 80 ug via INTRAVENOUS
  Administered 2018-03-23: 40 ug via INTRAVENOUS
  Administered 2018-03-23: 80 ug via INTRAVENOUS
  Administered 2018-03-23: 40 ug via INTRAVENOUS
  Administered 2018-03-23 (×2): 80 ug via INTRAVENOUS

## 2018-03-23 MED ORDER — GATIFLOXACIN 0.5 % OP SOLN
1.0000 [drp] | OPHTHALMIC | Status: AC | PRN
  Administered 2018-03-23 (×3): 1 [drp] via OPHTHALMIC
  Filled 2018-03-23: qty 2.5

## 2018-03-23 MED ORDER — SODIUM CHLORIDE (PF) 0.9 % IJ SOLN
INTRAMUSCULAR | Status: AC
  Filled 2018-03-23: qty 10

## 2018-03-23 SURGICAL SUPPLY — 81 items
APPLICATOR DR MATTHEWS STRL (MISCELLANEOUS) IMPLANT
BLADE EYE CATARACT 19 1.4 BEAV (BLADE) IMPLANT
BLADE MVR KNIFE 19G (BLADE) IMPLANT
BLADE MVR KNIFE 20G (BLADE) IMPLANT
CABLE BIPOLOR RESECTION CORD (MISCELLANEOUS) ×2 IMPLANT
CANNULA ANTERIOR CHAMBER 27GA (MISCELLANEOUS) IMPLANT
CANNULA DUAL BORE 23G (CANNULA) IMPLANT
CANNULA TROCAR 25G 6 VLV (OPHTHALMIC) IMPLANT
CANNULA TROCAR 25GA VLV (OPHTHALMIC) IMPLANT
CANNULA VLV SOFT TIP 27G (OPHTHALMIC) ×1 IMPLANT
CANNULA VLV SOFT TIP 27GA (OPHTHALMIC) ×3 IMPLANT
CLOSURE STERI-STRIP 1/2X4 (GAUZE/BANDAGES/DRESSINGS) ×1
CLSR STERI-STRIP ANTIMIC 1/2X4 (GAUZE/BANDAGES/DRESSINGS) ×1 IMPLANT
COTTONBALL LRG STERILE PKG (GAUZE/BANDAGES/DRESSINGS) ×9 IMPLANT
COVER MAYO STAND STRL (DRAPES) IMPLANT
COVER WAND RF STERILE (DRAPES) ×3 IMPLANT
DRAPE INCISE 51X51 W/FILM STRL (DRAPES) IMPLANT
DRAPE OPHTHALMIC 77X100 STRL (CUSTOM PROCEDURE TRAY) ×3 IMPLANT
FILTER BLUE MILLIPORE (MISCELLANEOUS) IMPLANT
FILTER STRAW FLUID ASPIR (MISCELLANEOUS) IMPLANT
FORCEPS ECKARDT ILM 25G SERR (OPHTHALMIC RELATED) IMPLANT
FORCEPS GRIESHABER ILM 27G (INSTRUMENTS) ×3 IMPLANT
GLOVE SS BIOGEL STRL SZ 6.5 (GLOVE) ×1 IMPLANT
GLOVE SS BIOGEL STRL SZ 7 (GLOVE) ×1 IMPLANT
GLOVE SUPERSENSE BIOGEL SZ 6.5 (GLOVE) ×2
GLOVE SUPERSENSE BIOGEL SZ 7 (GLOVE) ×2
GLOVE SURG 8.5 LATEX PF (GLOVE) ×3 IMPLANT
GLOVE SURG SS PI 6.5 STRL IVOR (GLOVE) ×2 IMPLANT
GOWN STRL REUS W/ TWL LRG LVL3 (GOWN DISPOSABLE) ×3 IMPLANT
GOWN STRL REUS W/TWL LRG LVL3 (GOWN DISPOSABLE) ×6
HANDLE PNEUMATIC FOR CONSTEL (OPHTHALMIC) IMPLANT
KIT BASIN OR (CUSTOM PROCEDURE TRAY) ×3 IMPLANT
KNIFE CRESCENT 2.5 55 ANG (BLADE) IMPLANT
LENS BIOM SUPER VIEW SET DISP (OPHTHALMIC RELATED) ×2 IMPLANT
MICROPICK 25G (MISCELLANEOUS)
NDL 18GX1X1/2 (RX/OR ONLY) (NEEDLE) ×1 IMPLANT
NDL 25GX 5/8IN NON SAFETY (NEEDLE) IMPLANT
NDL FILTER BLUNT 18X1 1/2 (NEEDLE) ×1 IMPLANT
NDL HYPO 30X.5 LL (NEEDLE) IMPLANT
NDL PRECISIONGLIDE 27X1.5 (NEEDLE) ×1 IMPLANT
NEEDLE 18GX1X1/2 (RX/OR ONLY) (NEEDLE) ×3 IMPLANT
NEEDLE 25GX 5/8IN NON SAFETY (NEEDLE) ×3 IMPLANT
NEEDLE FILTER BLUNT 18X 1/2SAF (NEEDLE) ×2
NEEDLE FILTER BLUNT 18X1 1/2 (NEEDLE) ×1 IMPLANT
NEEDLE HYPO 30X.5 LL (NEEDLE) IMPLANT
NEEDLE PRECISIONGLIDE 27X1.5 (NEEDLE) ×3 IMPLANT
NS IRRIG 1000ML POUR BTL (IV SOLUTION) ×3 IMPLANT
PACK VITRECTOMY CUSTOM (CUSTOM PROCEDURE TRAY) ×3 IMPLANT
PAD ARMBOARD 7.5X6 YLW CONV (MISCELLANEOUS) ×6 IMPLANT
PAK VITRECTOMY PIK  27GA (OPHTHALMIC) ×2
PAK VITRECTOMY PIK 27GA (OPHTHALMIC) ×1 IMPLANT
PENCIL BIPOLAR 25GA STR DISP (OPHTHALMIC RELATED) IMPLANT
PIC ILLUMINATED 25G (OPHTHALMIC) ×3
PICK MICROPICK 25G (MISCELLANEOUS) IMPLANT
PIK ILLUMINATED 25G (OPHTHALMIC) ×1 IMPLANT
PROBE DIATHERMY DSP 27GA (MISCELLANEOUS) ×7 IMPLANT
PROBE LASER ILLUM FLEX 27GA (OPHTHALMIC) ×5 IMPLANT
PROBE LASER ILLUM FLEX CVD 25G (OPHTHALMIC) IMPLANT
REPL STRA BRUSH NDL (NEEDLE) IMPLANT
REPL STRA BRUSH NEEDLE (NEEDLE) IMPLANT
RESERVOIR BACK FLUSH (MISCELLANEOUS) IMPLANT
ROLLS DENTAL (MISCELLANEOUS) ×6 IMPLANT
SCISSORS TIP ADVANCED DSP 25GA (INSTRUMENTS) IMPLANT
SCRAPER DIAMOND 25GA (OPHTHALMIC RELATED) IMPLANT
SCRAPER DIAMOND DUST MEMBRANE (MISCELLANEOUS) IMPLANT
SPONGE SURGIFOAM ABS GEL 12-7 (HEMOSTASIS) ×3 IMPLANT
STOPCOCK 4 WAY LG BORE MALE ST (IV SETS) IMPLANT
SUT CHROMIC 7 0 TG140 8 (SUTURE) IMPLANT
SUT ETHILON 10 0 CS140 6 (SUTURE) IMPLANT
SUT ETHILON 9 0 TG140 8 (SUTURE) IMPLANT
SUT POLY NON ABSORB 10-0 8 STR (SUTURE) IMPLANT
SUT SILK 4 0 RB 1 (SUTURE) IMPLANT
SYR 10ML LL (SYRINGE) IMPLANT
SYR 20CC LL (SYRINGE) ×5 IMPLANT
SYR 5ML LL (SYRINGE) IMPLANT
SYR BULB 3OZ (MISCELLANEOUS) ×3 IMPLANT
SYR TB 1ML LUER SLIP (SYRINGE) ×3 IMPLANT
TOWEL NATURAL 6PK STERILE (DISPOSABLE) ×3 IMPLANT
TUBING HIGH PRESS EXTEN 6IN (TUBING) IMPLANT
WATER STERILE IRR 1000ML POUR (IV SOLUTION) ×3 IMPLANT
WIPE INSTRUMENT VISIWIPE 73X73 (MISCELLANEOUS) IMPLANT

## 2018-03-23 NOTE — Progress Notes (Signed)
Per Dr. Zigmund Daniel, pt may take the Toprol XL 25 mg.

## 2018-03-23 NOTE — Brief Op Note (Signed)
Brief Operative note   Preoperative diagnosis:  vitreous hemorrhage left eye Postoperative diagnosis  Retinal break left eye.  Bridging retinal vessel left eye  Procedures: Pars plana vitrectomy, laser, endodiathermy of bridging vessel.  Gas fluid exchange left eye  Surgeon:  Hayden Pedro, MD...  Assistant:  Deatra Ina SA    Anesthesia: General  Specimen: none  Estimated blood loss:  1cc  Complications: none  Patient sent to PACU in good condition  Composed by Hayden Pedro MD  Dictation number: [id number]

## 2018-03-23 NOTE — Progress Notes (Signed)
Patient admitted to [facility]. S/P left vitrectomy. Oriented to room. Call bell in reach. Family at bedside.

## 2018-03-23 NOTE — Transfer of Care (Signed)
Immediate Anesthesia Transfer of Care Note  Patient: Tina Wallace  Procedure(s) Performed: PARS PLANA VITRECTOMY 27 GAUGE LEFT EYE (Left Eye) GAS/FLUID EXCHANGE LEFT EYE (Left Eye) LASER PHOTO ABLATION LEFT EYE (Left Eye)  Patient Location: PACU  Anesthesia Type:General  Level of Consciousness: awake, alert , oriented and patient cooperative  Airway & Oxygen Therapy: Patient Spontanous Breathing and Patient connected to nasal cannula oxygen  Post-op Assessment: Report given to RN and Post -op Vital signs reviewed and stable  Post vital signs: Reviewed  Last Vitals:  Vitals Value Taken Time  BP 112/91 03/23/2018  3:54 PM  Temp    Pulse 87 03/23/2018  3:54 PM  Resp 12 03/23/2018  3:54 PM  SpO2 96 % 03/23/2018  3:54 PM  Vitals shown include unvalidated device data.  Last Pain:  Vitals:   03/23/18 1034  TempSrc:   PainSc: 0-No pain      Patients Stated Pain Goal: 3 (58/85/02 7741)  Complications: No apparent anesthesia complications

## 2018-03-23 NOTE — Anesthesia Procedure Notes (Signed)
Procedure Name: Intubation Date/Time: 03/23/2018 2:42 PM Performed by: Jenne Campus, CRNA Pre-anesthesia Checklist: Patient identified, Emergency Drugs available, Suction available and Patient being monitored Patient Re-evaluated:Patient Re-evaluated prior to induction Oxygen Delivery Method: Circle System Utilized Preoxygenation: Pre-oxygenation with 100% oxygen Induction Type: IV induction Ventilation: Mask ventilation without difficulty Laryngoscope Size: Miller and 2 Grade View: Grade I Tube type: Oral Tube size: 7.0 mm Number of attempts: 1 Airway Equipment and Method: Stylet Placement Confirmation: ETT inserted through vocal cords under direct vision,  positive ETCO2 and breath sounds checked- equal and bilateral Secured at: 21 cm Tube secured with: Tape Dental Injury: Teeth and Oropharynx as per pre-operative assessment

## 2018-03-23 NOTE — H&P (Signed)
I examined the patient today and there is no change in the medical status 

## 2018-03-23 NOTE — Anesthesia Preprocedure Evaluation (Addendum)
Anesthesia Evaluation  Patient identified by MRN, date of birth, ID band Patient awake    Reviewed: Allergy & Precautions, NPO status , Patient's Chart, lab work & pertinent test results, reviewed documented beta blocker date and time   History of Anesthesia Complications Negative for: history of anesthetic complications  Airway Mallampati: I  TM Distance: >3 FB Neck ROM: Full    Dental  (+) Teeth Intact, Dental Advisory Given   Pulmonary neg pulmonary ROS,    breath sounds clear to auscultation       Cardiovascular hypertension, Pt. on medications and Pt. on home beta blockers + dysrhythmias  Rhythm:Regular     Neuro/Psych negative neurological ROS  negative psych ROS   GI/Hepatic negative GI ROS, Neg liver ROS,   Endo/Other  negative endocrine ROS  Renal/GU negative Renal ROS     Musculoskeletal negative musculoskeletal ROS (+)   Abdominal   Peds  Hematology negative hematology ROS (+)   Anesthesia Other Findings   Reproductive/Obstetrics                            Anesthesia Physical Anesthesia Plan  ASA: II  Anesthesia Plan: General   Post-op Pain Management:    Induction: Intravenous  PONV Risk Score and Plan: 3 and Ondansetron and Dexamethasone  Airway Management Planned: Oral ETT  Additional Equipment: None  Intra-op Plan:   Post-operative Plan: Extubation in OR  Informed Consent: I have reviewed the patients History and Physical, chart, labs and discussed the procedure including the risks, benefits and alternatives for the proposed anesthesia with the patient or authorized representative who has indicated his/her understanding and acceptance.   Dental advisory given  Plan Discussed with: CRNA and Surgeon  Anesthesia Plan Comments:         Anesthesia Quick Evaluation

## 2018-03-23 NOTE — Progress Notes (Signed)
Called placed to Dr. Zigmund Daniel office to inquire about the the pt being told to hold Toprol XL 25mg  prior to surgery. Per the office, he will call back.

## 2018-03-23 NOTE — Op Note (Signed)
NAME: JOHNANNA, BAKKE MEDICAL RECORD OE:[id number] ACCOUNT [id number] DATE OF BIRTH:Nov 02, 1960 FACILITY: [facility] LOCATION: [facility] PHYSICIAN:Katiana Ruland D. Treyon Wymore, MD  OPERATIVE REPORT  DATE OF PROCEDURE:  03/23/2018  ADMISSION DIAGNOSIS:  Retinal break, vitreous hemorrhage, left eye.  PROCEDURES:  Pars plana vitrectomy, endolaser gas fluid exchange in the left eye.  SURGEON:  Tempie Hoist, MD  ASSISTANT:  Deatra Ina, SA  ANESTHESIA:  General.  DESCRIPTION OF PROCEDURE:  Usual prep and drape.  The indirect ophthalmoscope laser was moved into place, and 372 burns were placed in the retinal periphery and weak areas of retina superiorly.  The inferior retina could not be seen because of dense  vitreous hemorrhage.  The attention was carried to the pars plana area where 27-gauge trocars were placed at 10, 2, and 4 o'clock, infusion at 4 o'clock.  The Provisc was placed on the corneal surface, and the BIOM viewing system was moved into place.   Pars plana vitrectomy was begun just behind the crystalline lens.  Dark red blood was seen in the vitreous as well as dense white fibrous blood.  The central blood was removed and down to the macular surface in a core vitrectomy manner.  The vitrectomy  was carried into the mid periphery with a 27-gauge tip and out into the far periphery.  Scleral depression was used to gain access to the extreme periphery.  The super-wide viewing system lens was placed on the BIOM at this point.  Additional vitrectomy  was carried out and rinsing of the vitreous cavity from hemorrhage.  A bridging blood vessel was seen.  This was treated with endodiathermy and trimmed.  The endolaser was positioned in the eye.  Then, 425 burns were placed around the retinal periphery  and around the retinal break.  The power was 300 mW, 1000 microns each and 0.1 seconds each.  Additional vitrectomy was carried out until all opacities were removed.  An 80% gas fluid exchange was then  performed.  The instruments were removed from the  eye.  The wounds were tested and found to be secure.  Polymyxin and ceftazidime were rinsed around the globe for antibiotic coverage.  Decadron 10 mg was injected into the lower subconjunctival space.  Marcaine was injected around the globe for postop  pain.  Closing pressure was 10 with a Barraquer tonometer.  Polysporin ophthalmic ointment, a patch and a shield were placed.  The patient was awakened and taken to recovery in satisfactory condition.  LN/NUANCE  D:03/23/2018 T:03/23/2018 JOB:[id number]

## 2018-03-24 ENCOUNTER — Encounter (HOSPITAL_COMMUNITY): Payer: Self-pay | Admitting: Ophthalmology

## 2018-03-24 DIAGNOSIS — H4312 Vitreous hemorrhage, left eye: Secondary | ICD-10-CM | POA: Diagnosis not present

## 2018-03-24 MED ORDER — PREDNISOLONE ACETATE 1 % OP SUSP: 1.0000 [drp] | Freq: Four times a day (QID) | OPHTHALMIC | 0 refills | Status: DC

## 2018-03-24 MED ORDER — GATIFLOXACIN 0.5 % OP SOLN: 1.0000 [drp] | Freq: Four times a day (QID) | OPHTHALMIC | Status: DC

## 2018-03-24 MED ORDER — BACITRACIN-POLYMYXIN B 500-10000 UNIT/GM OP OINT: 1.0000 "application " | TOPICAL_OINTMENT | Freq: Three times a day (TID) | OPHTHALMIC | 0 refills | Status: DC

## 2018-03-24 NOTE — Progress Notes (Signed)
03/24/2018, 6:22 AM  Mental Status:  Awake, Alert, Oriented  Anterior segment: Cornea  Clear    Anterior Chamber Clear    Lens:   Clear  Intra Ocular Pressure 15 mmHg with Tonopen  Vitreous: Clear 40%gas bubble   Retina:  Attached Good laser reaction   Impression: Excellent result Retina attached   Final Diagnosis: Active Problems:   Vitreous hemorrhage, left eye (HCC)   Plan: start post operative eye drops.  Discharge to home.  Give post operative instructions  Tina Wallace 03/24/2018, 6:22 AM

## 2018-03-24 NOTE — Progress Notes (Signed)
Patient received Diamox at 0207 and called 20 minutes later stating my tongue feels tingly. Vital signs stable. Patient denied any SOB, chest pain, rash, tongue swelling and difficulty swallowing. Patient was able to drink about 300 ml of water waster without any difficulty. Will continue to monitor

## 2018-03-24 NOTE — Discharge Summary (Signed)
Discharge summary not needed on [facility] patients per medical records. 

## 2018-03-25 NOTE — Anesthesia Postprocedure Evaluation (Signed)
Anesthesia Post Note  Patient: Tina Wallace  Procedure(s) Performed: PARS PLANA VITRECTOMY 27 GAUGE LEFT EYE (Left Eye) GAS/FLUID EXCHANGE LEFT EYE (Left Eye) LASER PHOTO ABLATION LEFT EYE (Left Eye)     Patient location during evaluation: PACU Anesthesia Type: General Level of consciousness: awake and alert Pain management: pain level controlled Vital Signs Assessment: post-procedure vital signs reviewed and stable Respiratory status: spontaneous breathing, nonlabored ventilation, respiratory function stable and patient connected to nasal cannula oxygen Cardiovascular status: blood pressure returned to baseline and stable Postop Assessment: no apparent nausea or vomiting Anesthetic complications: no    Last Vitals:  Vitals:   03/24/18 0232 03/24/18 0529  BP: 133/77 123/74  Pulse: 83 79  Resp: 18 17  Temp: (!) 36.3 C 36.6 C  SpO2: 99% 99%    Last Pain:  Vitals:   03/24/18 0529  TempSrc: Oral  PainSc:                  Dheeraj Hail

## 2018-03-26 ENCOUNTER — Encounter (INDEPENDENT_AMBULATORY_CARE_PROVIDER_SITE_OTHER): Payer: BLUE CROSS/BLUE SHIELD | Admitting: Ophthalmology

## 2018-03-26 DIAGNOSIS — H4312 Vitreous hemorrhage, left eye: Secondary | ICD-10-CM

## 2018-04-16 ENCOUNTER — Encounter (INDEPENDENT_AMBULATORY_CARE_PROVIDER_SITE_OTHER): Payer: 59 | Admitting: Ophthalmology

## 2018-04-16 DIAGNOSIS — H33303 Unspecified retinal break, bilateral: Secondary | ICD-10-CM

## 2018-04-21 ENCOUNTER — Encounter (INDEPENDENT_AMBULATORY_CARE_PROVIDER_SITE_OTHER): Payer: BLUE CROSS/BLUE SHIELD | Admitting: Ophthalmology

## 2018-05-25 ENCOUNTER — Encounter (INDEPENDENT_AMBULATORY_CARE_PROVIDER_SITE_OTHER): Payer: 59 | Admitting: Ophthalmology

## 2018-05-25 DIAGNOSIS — H43811 Vitreous degeneration, right eye: Secondary | ICD-10-CM

## 2018-05-25 DIAGNOSIS — I10 Essential (primary) hypertension: Secondary | ICD-10-CM

## 2018-05-25 DIAGNOSIS — H2513 Age-related nuclear cataract, bilateral: Secondary | ICD-10-CM

## 2018-05-25 DIAGNOSIS — H33303 Unspecified retinal break, bilateral: Secondary | ICD-10-CM | POA: Diagnosis not present

## 2018-05-25 DIAGNOSIS — H35033 Hypertensive retinopathy, bilateral: Secondary | ICD-10-CM

## 2018-06-28 ENCOUNTER — Encounter (INDEPENDENT_AMBULATORY_CARE_PROVIDER_SITE_OTHER): Payer: 59 | Admitting: Ophthalmology

## 2018-08-23 ENCOUNTER — Encounter (INDEPENDENT_AMBULATORY_CARE_PROVIDER_SITE_OTHER): Payer: 59 | Admitting: Ophthalmology

## 2018-08-23 ENCOUNTER — Other Ambulatory Visit: Payer: Self-pay

## 2018-08-23 DIAGNOSIS — I10 Essential (primary) hypertension: Secondary | ICD-10-CM | POA: Diagnosis not present

## 2018-08-23 DIAGNOSIS — H43811 Vitreous degeneration, right eye: Secondary | ICD-10-CM

## 2018-08-23 DIAGNOSIS — H35033 Hypertensive retinopathy, bilateral: Secondary | ICD-10-CM

## 2018-08-23 DIAGNOSIS — H2513 Age-related nuclear cataract, bilateral: Secondary | ICD-10-CM

## 2018-08-23 DIAGNOSIS — H33303 Unspecified retinal break, bilateral: Secondary | ICD-10-CM | POA: Diagnosis not present

## 2019-01-26 ENCOUNTER — Other Ambulatory Visit: Payer: Self-pay

## 2019-01-26 DIAGNOSIS — Z20822 Contact with and (suspected) exposure to covid-19: Secondary | ICD-10-CM

## 2019-01-28 ENCOUNTER — Telehealth: Payer: Self-pay | Admitting: General Practice

## 2019-01-28 LAB — NOVEL CORONAVIRUS, NAA: SARS-CoV-2, NAA: NOT DETECTED

## 2019-01-28 NOTE — Telephone Encounter (Signed)
Negative COVID results given. Patient results "NOT Detected." Caller expressed understanding. ° °

## 2019-02-15 ENCOUNTER — Other Ambulatory Visit: Payer: Self-pay | Admitting: Obstetrics and Gynecology

## 2019-02-15 DIAGNOSIS — Z1231 Encounter for screening mammogram for malignant neoplasm of breast: Secondary | ICD-10-CM

## 2019-03-15 ENCOUNTER — Ambulatory Visit
Admission: RE | Admit: 2019-03-15 | Discharge: 2019-03-15 | Disposition: A | Discharge status: Home / Self Care | Payer: 59 | Source: Ambulatory Visit | Attending: Obstetrics and Gynecology | Admitting: Obstetrics and Gynecology

## 2019-03-15 DIAGNOSIS — Z1231 Encounter for screening mammogram for malignant neoplasm of breast: Secondary | ICD-10-CM | POA: Diagnosis not present

## 2019-04-27 ENCOUNTER — Encounter (INDEPENDENT_AMBULATORY_CARE_PROVIDER_SITE_OTHER): Payer: No Typology Code available for payment source | Admitting: Ophthalmology

## 2019-04-27 DIAGNOSIS — I10 Essential (primary) hypertension: Secondary | ICD-10-CM | POA: Diagnosis not present

## 2019-04-27 DIAGNOSIS — H33303 Unspecified retinal break, bilateral: Secondary | ICD-10-CM

## 2019-04-27 DIAGNOSIS — H43813 Vitreous degeneration, bilateral: Secondary | ICD-10-CM

## 2019-04-27 DIAGNOSIS — H538 Other visual disturbances: Secondary | ICD-10-CM

## 2019-04-27 DIAGNOSIS — H2513 Age-related nuclear cataract, bilateral: Secondary | ICD-10-CM

## 2019-04-27 DIAGNOSIS — H35033 Hypertensive retinopathy, bilateral: Secondary | ICD-10-CM

## 2019-08-23 ENCOUNTER — Encounter (INDEPENDENT_AMBULATORY_CARE_PROVIDER_SITE_OTHER): Payer: 59 | Admitting: Ophthalmology

## 2020-02-21 ENCOUNTER — Other Ambulatory Visit: Payer: Self-pay | Admitting: Obstetrics and Gynecology

## 2020-02-21 DIAGNOSIS — Z1231 Encounter for screening mammogram for malignant neoplasm of breast: Secondary | ICD-10-CM

## 2020-03-22 ENCOUNTER — Ambulatory Visit
Admission: RE | Admit: 2020-03-22 | Discharge: 2020-03-22 | Disposition: A | Discharge status: Home / Self Care | Payer: No Typology Code available for payment source | Source: Ambulatory Visit | Attending: Obstetrics and Gynecology | Admitting: Obstetrics and Gynecology

## 2020-03-22 ENCOUNTER — Other Ambulatory Visit: Payer: Self-pay

## 2020-03-22 DIAGNOSIS — Z1231 Encounter for screening mammogram for malignant neoplasm of breast: Secondary | ICD-10-CM | POA: Diagnosis present

## 2020-04-12 ENCOUNTER — Ambulatory Visit (INDEPENDENT_AMBULATORY_CARE_PROVIDER_SITE_OTHER): Payer: No Typology Code available for payment source | Admitting: Dermatology

## 2020-04-12 ENCOUNTER — Other Ambulatory Visit: Payer: Self-pay

## 2020-04-12 ENCOUNTER — Encounter: Payer: Self-pay | Admitting: Dermatology

## 2020-04-12 DIAGNOSIS — L814 Other melanin hyperpigmentation: Secondary | ICD-10-CM

## 2020-04-12 DIAGNOSIS — Z1283 Encounter for screening for malignant neoplasm of skin: Secondary | ICD-10-CM

## 2020-04-12 DIAGNOSIS — Z85820 Personal history of malignant melanoma of skin: Secondary | ICD-10-CM

## 2020-04-12 DIAGNOSIS — L821 Other seborrheic keratosis: Secondary | ICD-10-CM

## 2020-04-12 DIAGNOSIS — L578 Other skin changes due to chronic exposure to nonionizing radiation: Secondary | ICD-10-CM | POA: Diagnosis not present

## 2020-04-12 DIAGNOSIS — D229 Melanocytic nevi, unspecified: Secondary | ICD-10-CM

## 2020-04-12 DIAGNOSIS — Z86018 Personal history of other benign neoplasm: Secondary | ICD-10-CM

## 2020-04-12 DIAGNOSIS — D1800 Hemangioma unspecified site: Secondary | ICD-10-CM

## 2020-04-12 DIAGNOSIS — L820 Inflamed seborrheic keratosis: Secondary | ICD-10-CM

## 2020-04-12 DIAGNOSIS — L918 Other hypertrophic disorders of the skin: Secondary | ICD-10-CM

## 2020-04-12 NOTE — Progress Notes (Unsigned)
Follow-Up Visit   Subjective  Tina Wallace is a 60 y.o. female who presents for the following: TBSE (Total body skin exam, hx of Melanoma L medial breat, hx of Dysplastic nevus R post flank). The patient presents for Total-Body Skin Exam (TBSE) for skin cancer screening and mole check.  The following portions of the chart were reviewed this encounter and updated as appropriate:   Tobacco  Allergies  Meds  Problems  Med Hx  Surg Hx  Fam Hx     Review of Systems:  No other skin or systemic complaints except as noted in HPI or Assessment and Plan.  Objective  Well appearing patient in no apparent distress; mood and affect are within normal limits.  A full examination was performed including scalp, head, eyes, ears, nose, lips, neck, chest, axillae, abdomen, back, buttocks, bilateral upper extremities, bilateral lower extremities, hands, feet, fingers, toes, fingernails, and toenails. All findings within normal limits unless otherwise noted below.  Objective  Right posterior flank: Scar with no evidence of recurrence.   Objective  Left medial breast: Well healed scar with no evidence of recurrence, no lymphadenopathy.   Objective  L cheek x 1: Erythematous keratotic or waxy stuck-on papule or plaque.    Assessment & Plan   Lentigines - Scattered tan macules - Discussed due to sun exposure - Benign, observe - Call for any changes  Seborrheic Keratoses - Stuck-on, waxy, tan-brown papules and plaques  - Discussed benign etiology and prognosis. - Observe - Call for any changes  Melanocytic Nevi - Tan-brown and/or pink-flesh-colored symmetric macules and papules - Benign appearing on exam today - Observation - Call clinic for new or changing moles - Recommend daily use of broad spectrum spf 30+ sunscreen to sun-exposed areas.   Hemangiomas - Red papules - Discussed benign nature - Observe - Call for any changes  Actinic Damage - Chronic, secondary to  cumulative UV/sun exposure - diffuse scaly erythematous macules with underlying dyspigmentation - Recommend daily broad spectrum sunscreen SPF 30+ to sun-exposed areas, reapply every 2 hours as needed.  - Call for new or changing lesions.  Skin cancer screening performed today.  Acrochordons (Skin Tags) - Fleshy, skin-colored pedunculated papules - Benign appearing.  - Observe. - If desired, they can be removed with an in office procedure that is not covered by insurance. - Please call the clinic if you notice any new or changing lesions.  History of dysplastic nevus Right posterior flank Clear. Observe for recurrence. Call clinic for new or changing lesions.  Recommend regular skin exams, daily broad-spectrum spf 30+ sunscreen use, and photoprotection.     History of melanoma 1985 Left medial breast Clear. Observe for recurrence. Call clinic for new or changing lesions.  Recommend regular skin exams, daily broad-spectrum spf 30+ sunscreen use, and photoprotection.     Inflamed seborrheic keratosis L cheek x 1  Destruction of lesion - L cheek x 1 Complexity: simple   Destruction method: cryotherapy   Informed consent: discussed and consent obtained   Timeout:  patient name, date of birth, surgical site, and procedure verified Lesion destroyed using liquid nitrogen: Yes   Region frozen until ice ball extended beyond lesion: Yes   Outcome: patient tolerated procedure well with no complications   Post-procedure details: wound care instructions given    Return in about 1 year (around 04/12/2021) for TBSE, Hx of Melanoma, Hx of Dysplastic nevi.  I, Ardis Rowan, RMA, am acting as scribe for Armida Sans, MD .  Documentation: I have reviewed the above documentation for accuracy and completeness, and I agree with the above.  Sarina Ser, MD

## 2020-04-13 ENCOUNTER — Encounter: Payer: Self-pay | Admitting: Dermatology

## 2020-04-26 ENCOUNTER — Encounter (INDEPENDENT_AMBULATORY_CARE_PROVIDER_SITE_OTHER): Payer: No Typology Code available for payment source | Admitting: Ophthalmology

## 2020-05-08 ENCOUNTER — Encounter (INDEPENDENT_AMBULATORY_CARE_PROVIDER_SITE_OTHER): Payer: No Typology Code available for payment source | Admitting: Ophthalmology

## 2020-05-19 IMAGING — MG DIGITAL SCREENING BILAT W/ TOMO W/ CAD
8 series · 9 of 24 positions shown · non-contrast
Comparison: Previous exam(s).

CLINICAL DATA: Screening.

EXAM:
DIGITAL SCREENING BILATERAL MAMMOGRAM WITH TOMO AND CAD

[L CC synth-2D]
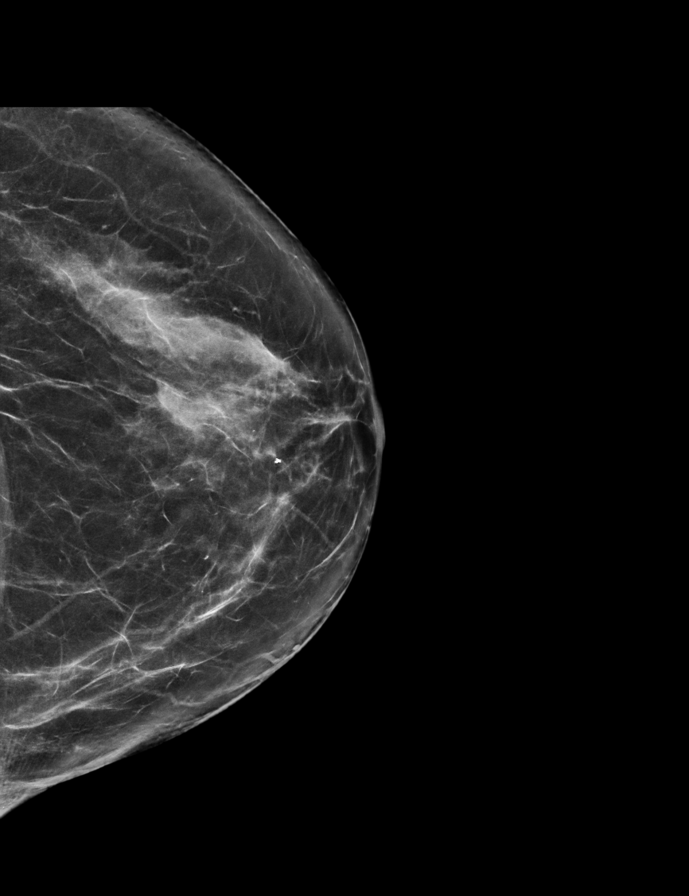

[R CC synth-2D]
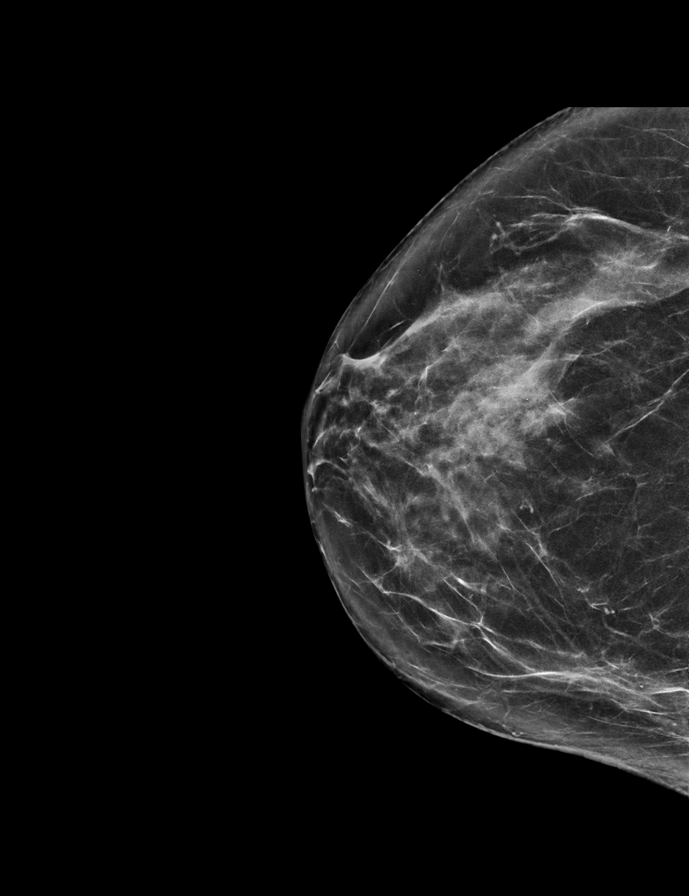

[R MLO synth-2D]
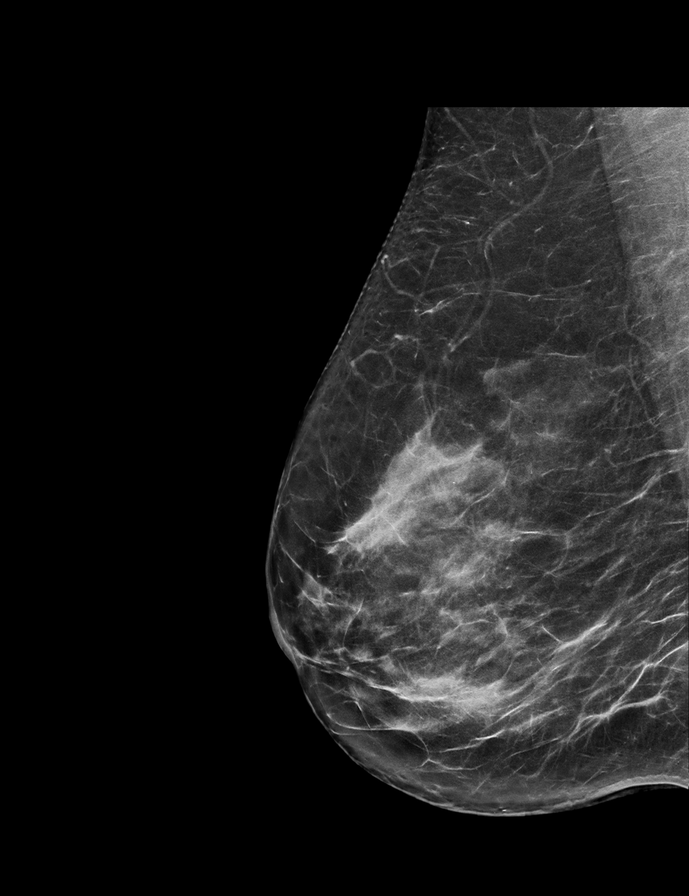

[L MLO synth-2D]
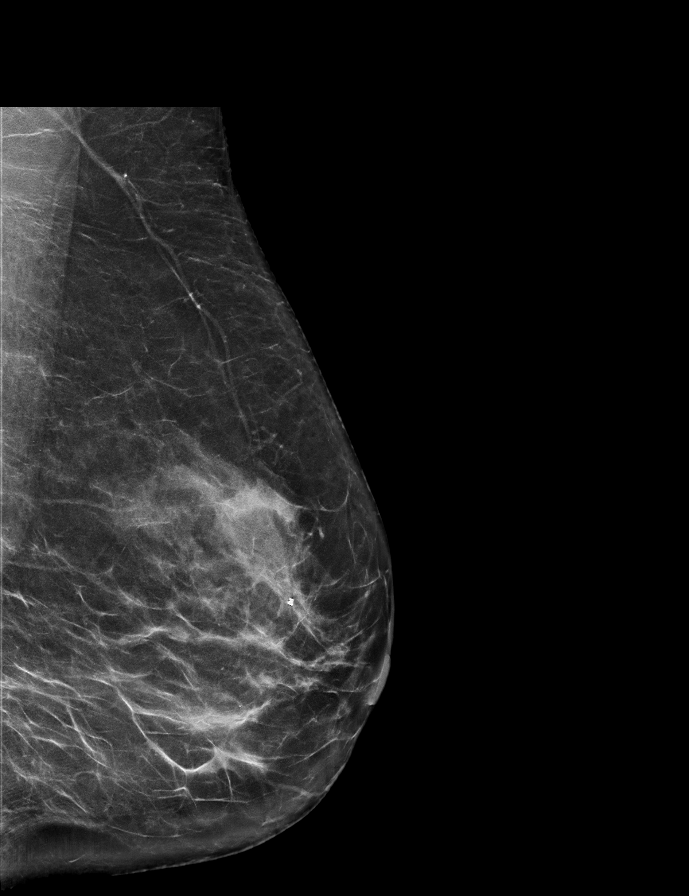

[L CC tomo · 2 of 68 frames shown]
[frame 22/68]
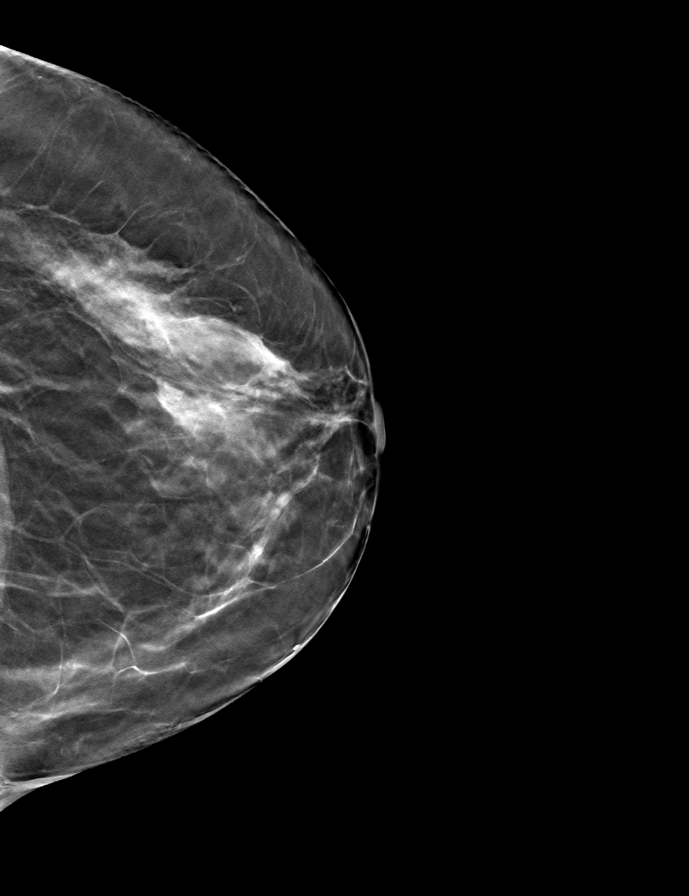
[frame 35/68]
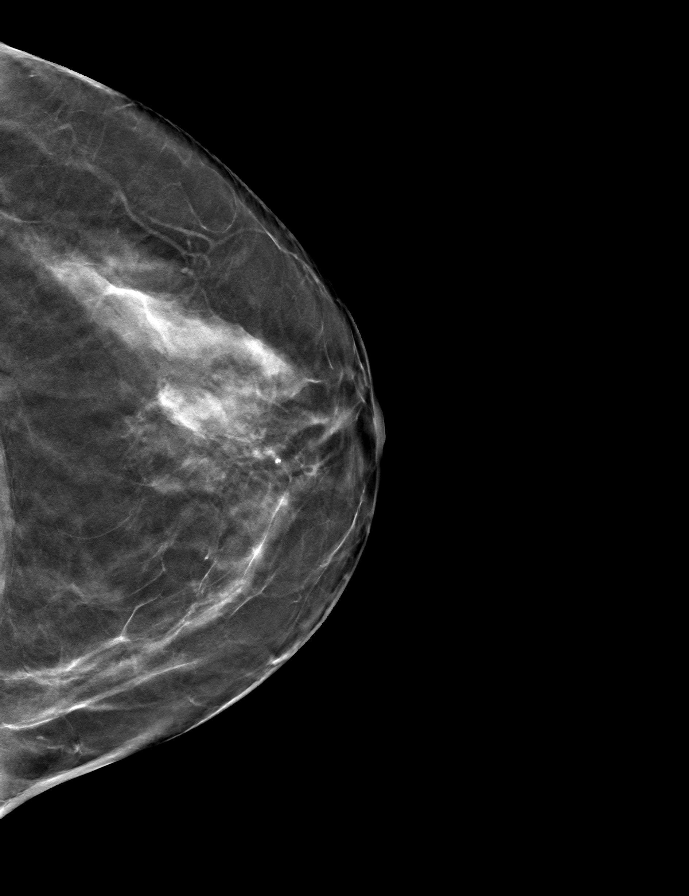

[R MLO tomo · tomo slice 35/70.0]
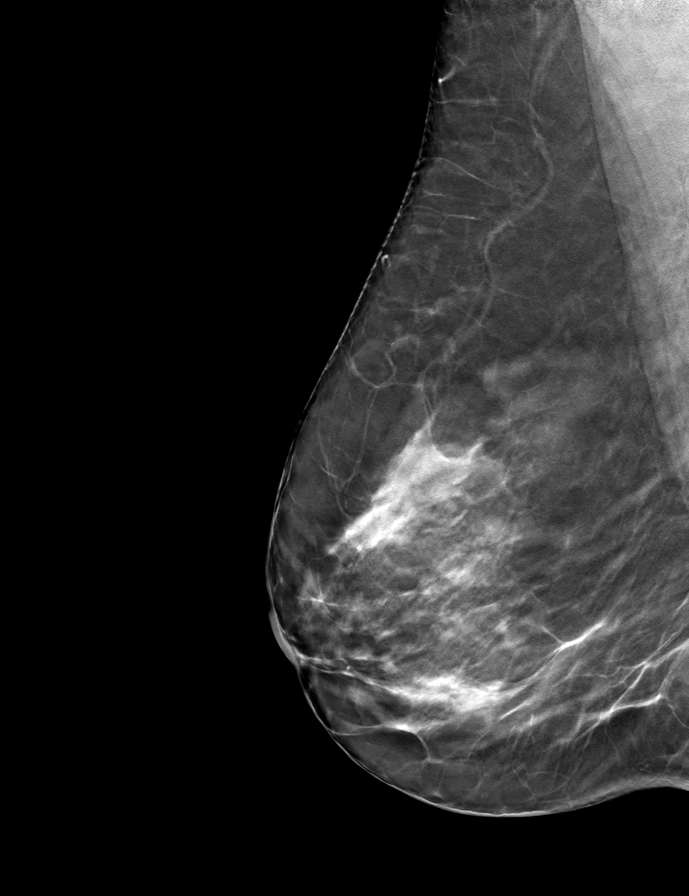

[L MLO tomo · tomo slice 36/71.0]
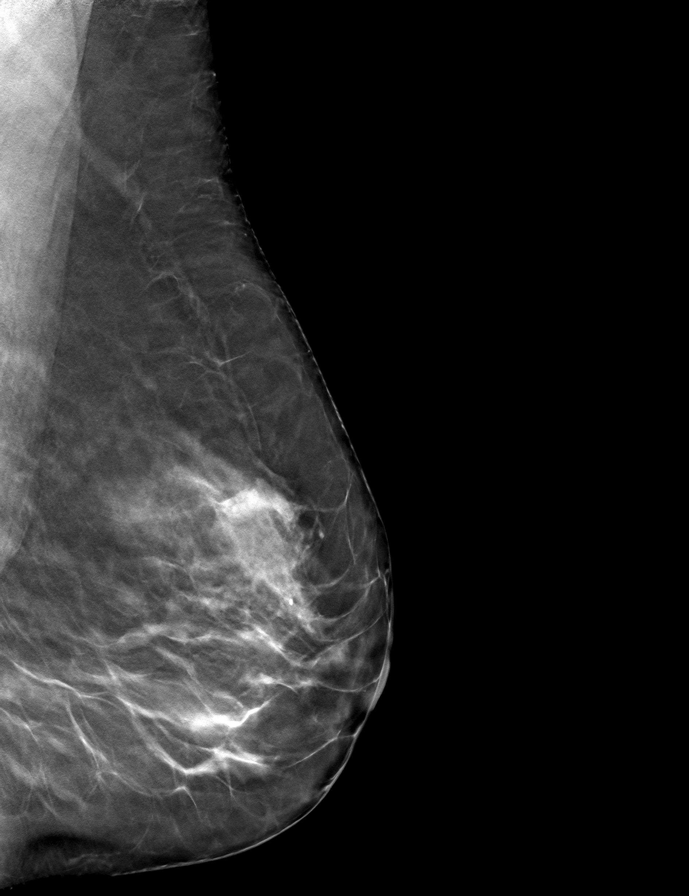

[R CC tomo · tomo slice 33/65.0]
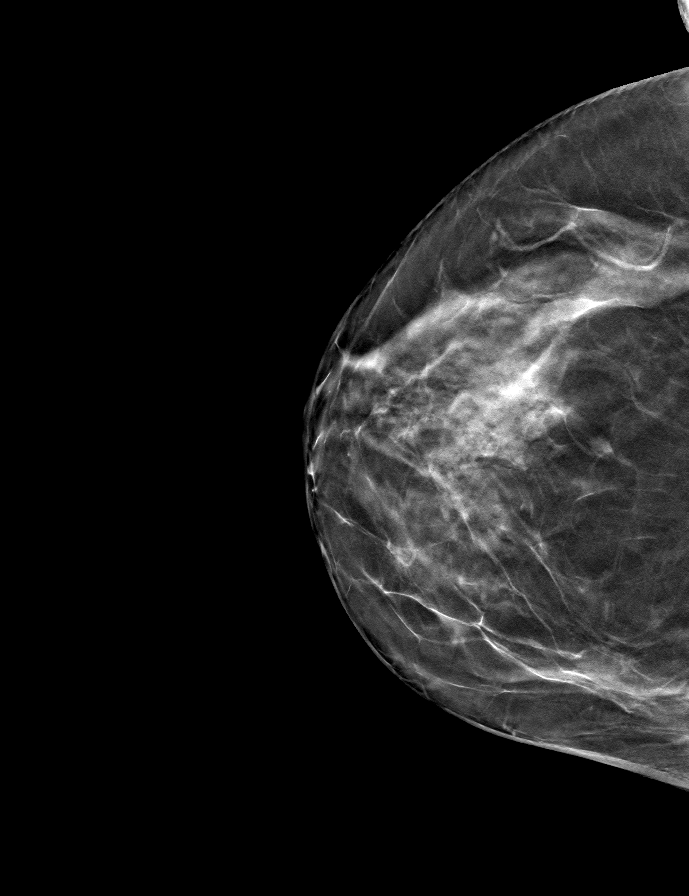

[9 of 24 positions shown; findings below may reference images not displayed]

ACR Breast Density Category c: The breast tissue is heterogeneously
dense, which may obscure small masses.
FINDINGS: There are no findings suspicious for malignancy. Images were
processed with CAD.
IMPRESSION: No mammographic evidence of malignancy. A result letter of this
screening mammogram will be mailed directly to the patient.

RECOMMENDATION:
Screening mammogram in one year. (Code:FT-[id number])

BI-RADS CATEGORY  1: Negative.

## 2020-05-22 ENCOUNTER — Encounter (INDEPENDENT_AMBULATORY_CARE_PROVIDER_SITE_OTHER): Payer: BC Managed Care – PPO | Admitting: Ophthalmology

## 2020-05-22 ENCOUNTER — Other Ambulatory Visit: Payer: Self-pay

## 2020-05-22 DIAGNOSIS — H33303 Unspecified retinal break, bilateral: Secondary | ICD-10-CM

## 2020-05-22 DIAGNOSIS — H43811 Vitreous degeneration, right eye: Secondary | ICD-10-CM | POA: Diagnosis not present

## 2020-05-22 DIAGNOSIS — I10 Essential (primary) hypertension: Secondary | ICD-10-CM | POA: Diagnosis not present

## 2020-05-22 DIAGNOSIS — H35033 Hypertensive retinopathy, bilateral: Secondary | ICD-10-CM | POA: Diagnosis not present

## 2021-03-07 ENCOUNTER — Other Ambulatory Visit: Payer: Self-pay | Admitting: Obstetrics and Gynecology

## 2021-03-07 DIAGNOSIS — Z1231 Encounter for screening mammogram for malignant neoplasm of breast: Secondary | ICD-10-CM

## 2021-03-26 ENCOUNTER — Other Ambulatory Visit: Payer: Self-pay

## 2021-03-26 ENCOUNTER — Ambulatory Visit
Admission: RE | Admit: 2021-03-26 | Discharge: 2021-03-26 | Disposition: A | Discharge status: Home / Self Care | Payer: BC Managed Care – PPO | Source: Ambulatory Visit | Attending: Obstetrics and Gynecology | Admitting: Obstetrics and Gynecology

## 2021-03-26 DIAGNOSIS — Z1231 Encounter for screening mammogram for malignant neoplasm of breast: Secondary | ICD-10-CM | POA: Diagnosis not present

## 2021-04-11 ENCOUNTER — Encounter: Payer: Self-pay | Admitting: Dermatology

## 2021-04-11 ENCOUNTER — Other Ambulatory Visit: Payer: Self-pay

## 2021-04-11 ENCOUNTER — Ambulatory Visit: Payer: BC Managed Care – PPO | Admitting: Dermatology

## 2021-04-11 DIAGNOSIS — D229 Melanocytic nevi, unspecified: Secondary | ICD-10-CM

## 2021-04-11 DIAGNOSIS — L821 Other seborrheic keratosis: Secondary | ICD-10-CM

## 2021-04-11 DIAGNOSIS — L578 Other skin changes due to chronic exposure to nonionizing radiation: Secondary | ICD-10-CM | POA: Diagnosis not present

## 2021-04-11 DIAGNOSIS — Z1283 Encounter for screening for malignant neoplasm of skin: Secondary | ICD-10-CM | POA: Diagnosis not present

## 2021-04-11 DIAGNOSIS — I781 Nevus, non-neoplastic: Secondary | ICD-10-CM | POA: Diagnosis not present

## 2021-04-11 DIAGNOSIS — D1800 Hemangioma unspecified site: Secondary | ICD-10-CM

## 2021-04-11 DIAGNOSIS — Z85820 Personal history of malignant melanoma of skin: Secondary | ICD-10-CM

## 2021-04-11 DIAGNOSIS — L814 Other melanin hyperpigmentation: Secondary | ICD-10-CM

## 2021-04-11 DIAGNOSIS — Z86018 Personal history of other benign neoplasm: Secondary | ICD-10-CM

## 2021-04-11 NOTE — Progress Notes (Signed)
° °  Follow-Up Visit   Subjective  Tina Wallace is a 61 y.o. female who presents for the following: Annual Exam (Hx dysplastic nevus and MM ). The patient presents for Total-Body Skin Exam (TBSE) for skin cancer screening and mole check.  The patient has spots, moles and lesions to be evaluated, some may be new or changing.  The following portions of the chart were reviewed this encounter and updated as appropriate:   Tobacco   Allergies   Meds   Problems   Med Hx   Surg Hx   Fam Hx      Review of Systems:  No other skin or systemic complaints except as noted in HPI or Assessment and Plan.  Objective  Well appearing patient in no apparent distress; mood and affect are within normal limits.  A full examination was performed including scalp, head, eyes, ears, nose, lips, neck, chest, axillae, abdomen, back, buttocks, bilateral upper extremities, bilateral lower extremities, hands, feet, fingers, toes, fingernails, and toenails. All findings within normal limits unless otherwise noted below.  L infra orbital, R nose Dilated blood vessel.   Assessment & Plan  Telangiectasia L infra orbital, R nose Exam by dermoscopy performed. Benign-appearing.  Observation.  Call clinic for new or changing lesions.  Recommend daily use of broad spectrum spf 30+ sunscreen to sun-exposed areas.   Skin cancer screening  Lentigines - Scattered tan macules - Due to sun exposure - Benign-appearing, observe - Recommend daily broad spectrum sunscreen SPF 30+ to sun-exposed areas, reapply every 2 hours as needed. - Call for any changes  Seborrheic Keratoses - Stuck-on, waxy, tan-brown papules and/or plaques  - Benign-appearing - Discussed benign etiology and prognosis. - Observe - Call for any changes  Melanocytic Nevi - Tan-brown and/or pink-flesh-colored symmetric macules and papules - Benign appearing on exam today - Observation - Call clinic for new or changing moles - Recommend daily use of  broad spectrum spf 30+ sunscreen to sun-exposed areas.   Hemangiomas - Red papules - Discussed benign nature - Observe - Call for any changes  Actinic Damage - Chronic condition, secondary to cumulative UV/sun exposure - diffuse scaly erythematous macules with underlying dyspigmentation - Recommend daily broad spectrum sunscreen SPF 30+ to sun-exposed areas, reapply every 2 hours as needed.  - Staying in the shade or wearing long sleeves, sun glasses (UVA+UVB protection) and wide brim hats (4-inch brim around the entire circumference of the hat) are also recommended for sun protection.  - Call for new or changing lesions.  History of Dysplastic Nevus - No evidence of recurrence today - Recommend regular full body skin exams - Recommend daily broad spectrum sunscreen SPF 30+ to sun-exposed areas, reapply every 2 hours as needed.  - Call if any new or changing lesions are noted between office visits  History of Melanoma - No evidence of recurrence today - No lymphadenopathy - Recommend regular full body skin exams - Recommend daily broad spectrum sunscreen SPF 30+ to sun-exposed areas, reapply every 2 hours as needed.  - Call if any new or changing lesions are noted between office visits  Skin cancer screening performed today.  Return in about 1 year (around 04/11/2022) for TBSE.  Luther Redo, CMA, am acting as scribe for Sarina Ser, MD . Documentation: I have reviewed the above documentation for accuracy and completeness, and I agree with the above.  Sarina Ser, MD

## 2021-04-11 NOTE — Patient Instructions (Signed)

## 2022-02-19 ENCOUNTER — Other Ambulatory Visit: Payer: Self-pay | Admitting: Obstetrics and Gynecology

## 2022-02-19 DIAGNOSIS — Z1231 Encounter for screening mammogram for malignant neoplasm of breast: Secondary | ICD-10-CM

## 2022-04-10 ENCOUNTER — Ambulatory Visit
Admission: RE | Admit: 2022-04-10 | Discharge: 2022-04-10 | Disposition: A | Discharge status: Home / Self Care | Payer: BC Managed Care – PPO | Source: Ambulatory Visit | Attending: Obstetrics and Gynecology | Admitting: Obstetrics and Gynecology

## 2022-04-10 DIAGNOSIS — Z1231 Encounter for screening mammogram for malignant neoplasm of breast: Secondary | ICD-10-CM | POA: Diagnosis not present

## 2022-04-17 ENCOUNTER — Ambulatory Visit (INDEPENDENT_AMBULATORY_CARE_PROVIDER_SITE_OTHER): Payer: BC Managed Care – PPO | Admitting: Dermatology

## 2022-04-17 DIAGNOSIS — Z1283 Encounter for screening for malignant neoplasm of skin: Secondary | ICD-10-CM

## 2022-04-17 DIAGNOSIS — L821 Other seborrheic keratosis: Secondary | ICD-10-CM

## 2022-04-17 DIAGNOSIS — L820 Inflamed seborrheic keratosis: Secondary | ICD-10-CM | POA: Diagnosis not present

## 2022-04-17 DIAGNOSIS — L814 Other melanin hyperpigmentation: Secondary | ICD-10-CM

## 2022-04-17 DIAGNOSIS — L578 Other skin changes due to chronic exposure to nonionizing radiation: Secondary | ICD-10-CM

## 2022-04-17 DIAGNOSIS — D229 Melanocytic nevi, unspecified: Secondary | ICD-10-CM

## 2022-04-17 DIAGNOSIS — Z85820 Personal history of malignant melanoma of skin: Secondary | ICD-10-CM

## 2022-04-17 NOTE — Patient Instructions (Addendum)
Cryotherapy Aftercare  Wash gently with soap and water everyday.   Apply Vaseline and Band-Aid daily until healed.     Due to recent changes in healthcare laws, you may see results of your pathology and/or laboratory studies on MyChart before the doctors have had a chance to review them. We understand that in some cases there may be results that are confusing or concerning to you. Please understand that not all results are received at the same time and often the doctors may need to interpret multiple results in order to provide you with the best plan of care or course of treatment. Therefore, we ask that you please give us 2 business days to thoroughly review all your results before contacting the office for clarification. Should we see a critical lab result, you will be contacted sooner.   If You Need Anything After Your Visit  If you have any questions or concerns for your doctor, please call our main line at [phone] and press option 4 to reach your doctor's medical assistant. If no one answers, please leave a voicemail as directed and we will return your call as soon as possible. Messages left after 4 pm will be answered the following business day.   You may also send us a message via MyChart. We typically respond to MyChart messages within 1-2 business days.  For prescription refills, please ask your pharmacy to contact our office. Our fax number is [phone].  If you have an urgent issue when the clinic is closed that cannot wait until the next business day, you can page your doctor at the number below.    Please note that while we do our best to be available for urgent issues outside of office hours, we are not available 24/7.   If you have an urgent issue and are unable to reach us, you may choose to seek medical care at your doctor's office, retail clinic, urgent care center, or emergency room.  If you have a medical emergency, please immediately call 911 or go to the  emergency department.  Pager Numbers  - Dr. Kowalski: [phone]  - Dr. Moye: [phone]  - Dr. Stewart: [phone]  In the event of inclement weather, please call our main line at [phone] for an update on the status of any delays or closures.  Dermatology Medication Tips: Please keep the boxes that topical medications come in in order to help keep track of the instructions about where and how to use these. Pharmacies typically print the medication instructions only on the boxes and not directly on the medication tubes.   If your medication is too expensive, please contact our office at [phone] option 4 or send us a message through MyChart.   We are unable to tell what your co-pay for medications will be in advance as this is different depending on your insurance coverage. However, we may be able to find a substitute medication at lower cost or fill out paperwork to get insurance to cover a needed medication.   If a prior authorization is required to get your medication covered by your insurance company, please allow us 1-2 business days to complete this process.  Drug prices often vary depending on where the prescription is filled and some pharmacies may offer cheaper prices.  The website www.goodrx.com contains coupons for medications through different pharmacies. The prices here do not account for what the cost may be with help from insurance (it may be cheaper with your insurance), but the website can   give you the price if you did not use any insurance.  - You can print the associated coupon and take it with your prescription to the pharmacy.  - You may also stop by our office during regular business hours and pick up a GoodRx coupon card.  - If you need your prescription sent electronically to a different pharmacy, notify our office through [facility] MyChart or by phone at [phone] option 4.     Si Usted Necesita Algo Despus de Su Visita  Tambin puede  enviarnos un mensaje a travs de MyChart. Por lo general respondemos a los mensajes de MyChart en el transcurso de 1 a 2 das hbiles.  Para renovar recetas, por favor pida a su farmacia que se ponga en contacto con nuestra oficina. Nuestro nmero de fax es el [phone].  Si tiene un asunto urgente cuando la clnica est cerrada y que no puede esperar hasta el siguiente da hbil, puede llamar/localizar a su doctor(a) al nmero que aparece a continuacin.   Por favor, tenga en cuenta que aunque hacemos todo lo posible para estar disponibles para asuntos urgentes fuera del horario de oficina, no estamos disponibles las 24 horas del da, los 7 das de la semana.   Si tiene un problema urgente y no puede comunicarse con nosotros, puede optar por buscar atencin mdica  en el consultorio de su doctor(a), en una clnica privada, en un centro de atencin urgente o en una sala de emergencias.  Si tiene una emergencia mdica, por favor llame inmediatamente al 911 o vaya a la sala de emergencias.  Nmeros de bper  - Dr. Kowalski: [phone]  - Dra. Moye: [phone]  - Dra. Stewart: [phone]  En caso de inclemencias del tiempo, por favor llame a nuestra lnea principal al [phone] para una actualizacin sobre el estado de cualquier retraso o cierre.  Consejos para la medicacin en dermatologa: Por favor, guarde las cajas en las que vienen los medicamentos de uso tpico para ayudarle a seguir las instrucciones sobre dnde y cmo usarlos. Las farmacias generalmente imprimen las instrucciones del medicamento slo en las cajas y no directamente en los tubos del medicamento.   Si su medicamento es muy caro, por favor, pngase en contacto con nuestra oficina llamando al [phone] y presione la opcin 4 o envenos un mensaje a travs de MyChart.   No podemos decirle cul ser su copago por los medicamentos por adelantado ya que esto es diferente dependiendo de la cobertura de su seguro.  Sin embargo, es posible que podamos encontrar un medicamento sustituto a menor costo o llenar un formulario para que el seguro cubra el medicamento que se considera necesario.   Si se requiere una autorizacin previa para que su compaa de seguros cubra su medicamento, por favor permtanos de 1 a 2 das hbiles para completar este proceso.  Los precios de los medicamentos varan con frecuencia dependiendo del lugar de dnde se surte la receta y alguna farmacias pueden ofrecer precios ms baratos.  El sitio web www.goodrx.com tiene cupones para medicamentos de diferentes farmacias. Los precios aqu no tienen en cuenta lo que podra costar con la ayuda del seguro (puede ser ms barato con su seguro), pero el sitio web puede darle el precio si no utiliz ningn seguro.  - Puede imprimir el cupn correspondiente y llevarlo con su receta a la farmacia.  - Tambin puede pasar por nuestra oficina durante el horario de atencin regular y recoger una tarjeta de cupones de GoodRx.  -   Si necesita que su receta se enve electrnicamente a una farmacia diferente, informe a nuestra oficina a travs de MyChart de [facility] o por telfono llamando al [phone] y presione la opcin 4.  

## 2022-04-17 NOTE — Progress Notes (Signed)
Follow-Up Visit   Subjective  Tina Wallace is a 62 y.o. female who presents for the following: Annual Exam (Mole check ). Hx of Melanoma of the left breast The patient presents for Total-Body Skin Exam (TBSE) for skin cancer screening and mole check.  The patient has spots, moles and lesions to be evaluated, some may be new or changing and the patient has concerns that these could be cancer.   The following portions of the chart were reviewed this encounter and updated as appropriate:   Tobacco  Allergies  Meds  Problems  Med Hx  Surg Hx  Fam Hx     Review of Systems:  No other skin or systemic complaints except as noted in HPI or Assessment and Plan.  Objective  Well appearing patient in no apparent distress; mood and affect are within normal limits.  A full examination was performed including scalp, head, eyes, ears, nose, lips, neck, chest, axillae, abdomen, back, buttocks, bilateral upper extremities, bilateral lower extremities, hands, feet, fingers, toes, fingernails, and toenails. All findings within normal limits unless otherwise noted below.  right medial knee x 1, forehead hairline x 1, left shoulder x 1  (3) (3) Stuck-on, waxy, tan-brown papules --Discussed benign etiology and prognosis.    Assessment & Plan  Inflamed seborrheic keratosis (3) right medial knee x 1, forehead hairline x 1, left shoulder x 1  (3)  Symptomatic, irritating, patient would like treated.   Destruction of lesion - right medial knee x 1, forehead hairline x 1, left shoulder x 1  (3) Complexity: simple   Destruction method: cryotherapy   Informed consent: discussed and consent obtained   Timeout:  patient name, date of birth, surgical site, and procedure verified Lesion destroyed using liquid nitrogen: Yes   Region frozen until ice ball extended beyond lesion: Yes   Outcome: patient tolerated procedure well with no complications   Post-procedure details: wound care instructions given     Lentigines - Scattered tan macules - Due to sun exposure - Benign-appearing, observe - Recommend daily broad spectrum sunscreen SPF 30+ to sun-exposed areas, reapply every 2 hours as needed. - Call for any changes  Seborrheic Keratoses - Stuck-on, waxy, tan-brown papules and/or plaques  - Benign-appearing - Discussed benign etiology and prognosis. - Observe - Call for any changes  Melanocytic Nevi - Tan-brown and/or pink-flesh-colored symmetric macules and papules - Benign appearing on exam today - Observation - Call clinic for new or changing moles - Recommend daily use of broad spectrum spf 30+ sunscreen to sun-exposed areas.   Hemangiomas - Red papules - Discussed benign nature - Observe - Call for any changes  Actinic Damage - Chronic condition, secondary to cumulative UV/sun exposure - diffuse scaly erythematous macules with underlying dyspigmentation - Recommend daily broad spectrum sunscreen SPF 30+ to sun-exposed areas, reapply every 2 hours as needed.  - Staying in the shade or wearing long sleeves, sun glasses (UVA+UVB protection) and wide brim hats (4-inch brim around the entire circumference of the hat) are also recommended for sun protection.  - Call for new or changing lesions.  History of Melanoma Left medial breast-1985 - No evidence of recurrence today - No lymphadenopathy - Recommend regular full body skin exams - Recommend daily broad spectrum sunscreen SPF 30+ to sun-exposed areas, reapply every 2 hours as needed.  - Call if any new or changing lesions are noted between office visits   Skin cancer screening performed today.   Return in about 1 year (  around 04/18/2023) for TBSSE, hx of Melanoma.  IMarye Round, CMA, am acting as scribe for Sarina Ser, MD .  Documentation: I have reviewed the above documentation for accuracy and completeness, and I agree with the above.  Sarina Ser, MD

## 2022-04-22 ENCOUNTER — Encounter: Payer: Self-pay | Admitting: Dermatology

## 2022-05-27 ENCOUNTER — Inpatient Hospital Stay: Payer: BC Managed Care – PPO | Attending: Oncology | Admitting: Licensed Clinical Social Worker

## 2022-05-27 ENCOUNTER — Inpatient Hospital Stay: Payer: BC Managed Care – PPO

## 2022-05-27 DIAGNOSIS — Z800 Family history of malignant neoplasm of digestive organs: Secondary | ICD-10-CM | POA: Diagnosis not present

## 2022-05-27 DIAGNOSIS — Z803 Family history of malignant neoplasm of breast: Secondary | ICD-10-CM

## 2022-05-27 DIAGNOSIS — Z86006 Personal history of melanoma in-situ: Secondary | ICD-10-CM

## 2022-05-27 DIAGNOSIS — Z8049 Family history of malignant neoplasm of other genital organs: Secondary | ICD-10-CM | POA: Diagnosis not present

## 2022-05-28 ENCOUNTER — Encounter: Payer: Self-pay | Admitting: Licensed Clinical Social Worker

## 2022-05-28 NOTE — Progress Notes (Signed)
REFERRING PROVIDER: Benjaman Kindler, [address] [place],  [place] [address]  PRIMARY PROVIDER:  Rusty Aus, MD  PRIMARY REASON FOR VISIT:  1. Family history of breast cancer   2. Family history of uterine cancer   3. Family history of cervical cancer   4. Personal history of melanoma in-situ   5. Family history of stomach cancer      HISTORY OF PRESENT ILLNESS:   Tina Wallace, a 62 y.o. female, was seen for a [facility] cancer genetics consultation at the request of Dr. Leafy Ro due to a family history of cancer.  Tina Wallace presents to clinic today to discuss the possibility of a hereditary predisposition to cancer, genetic testing, and to further clarify her future cancer risks, as well as potential cancer risks for family members.   CANCER HISTORY:   Tina Wallace is a 62 y.o. female with no personal history of cancer aside from melanoma removed from her left breast at age 12.   RISK FACTORS:  Menarche was at age 15.  First live birth at age 40.  OCP use for approximately 4 years.  Ovaries intact: yes.  Hysterectomy: no.  Menopausal status: postmenopausal.  HRT use: 0 years. Colonoscopy: yes; normal. Mammogram within the last year: yes. Number of breast biopsies: 1. Up to date with pelvic exams: yes. Any excessive radiation exposure in the past: no  Past Medical History:  Diagnosis Date   Anemia    PMH: with pregnanaacy only   Cancer ([facility]) 1985   melanoma   Hx of dysplastic nevus 10/06/2007   right post flank    Melanoma ([facility]) 1985   L medial breast   SVT (supraventricular tachycardia)    Vitreous hemorrhage, left eye ([facility])    retinal break left eye   Wears glasses     Past Surgical History:  Procedure Laterality Date   ABLATION     for SVT   BREAST BIOPSY Left 12/11/11   bx/clip-neg- Benign Breast Tissue   BREAST CYST ASPIRATION Right 03/07/00   neg   CARDIAC CATHETERIZATION     2005 at [facility]     x 2   CHOLECYSTECTOMY      COLONOSCOPY W/ BIOPSIES AND POLYPECTOMY     EYE SURGERY     eye laser sx B/L in MD office   GAS/FLUID EXCHANGE Left 03/23/2018   Procedure: GAS/FLUID EXCHANGE LEFT EYE;  Surgeon: Hayden Pedro, MD;  Location: [facility];  Service: Ophthalmology;  Laterality: Left;   LASER PHOTO ABLATION Left 03/23/2018   Procedure: LASER PHOTO ABLATION LEFT EYE;  Surgeon: Hayden Pedro, MD;  Location: [facility];  Service: Ophthalmology;  Laterality: Left;   PARS PLANA VITRECTOMY 27 GAUGE Left 03/23/2018   Procedure: PARS PLANA VITRECTOMY 27 GAUGE LEFT EYE;  Surgeon: Hayden Pedro, MD;  Location: [facility];  Service: Ophthalmology;  Laterality: Left;    FAMILY HISTORY:  We obtained a detailed, 4-generation family history.  Significant diagnoses are listed below: Family History  Problem Relation Age of Onset   Breast cancer Mother 26   Cancer Mother        cervical/uterine dx 60s; hysterectomy   COPD Father    Cancer Paternal Aunt        possibly stomach, d. 49s   Stomach cancer Paternal Grandfather        d. 37s   Tina Wallace has 1 daughter, 38 and 2 sons, 63 and 46. She has 2  brothers. One has had skin cancer in the past, basal cell carcinoma.   Tina Wallace mother had cervical/uterine cancer in her 90s and had a hysterectomy. She then had breast cancer at 69 and is currently living at 15. No other known cancers on this side of the family.  Tina Wallace's father died at 71. He had a sister who had cancer and died in her 62s, possibly stomach cancer. Paternal grandfather had stomach cancer and died in his 78s.   Tina Wallace is unaware of previous family history of genetic testing for hereditary cancer risks. There is no reported Ashkenazi Jewish ancestry. There is no known consanguinity.    GENETIC COUNSELING ASSESSMENT: Tina Wallace is a 62 y.o. female with a family history of cancer which is somewhat suggestive of a hereditary cancer syndrome and predisposition to cancer. We, therefore,  discussed and recommended the following at today's visit.   DISCUSSION: We discussed that approximately 10% of breast cancer is hereditary. Most cases of hereditary breast cancer are associated with BRCA1/BRCA2 genes, although there are other genes associated with hereditary cancer as well, including the Lynch syndrome genes which increase the risk for uterine cancer.. Cancers and risks are gene specific. We discussed that testing is beneficial for several reasons including knowing about cancer risks, identifying potential screening and risk-reduction options that may be appropriate, and to understand if other family members could be at risk for cancer and allow them to undergo genetic testing.   We reviewed the characteristics, features and inheritance patterns of hereditary cancer syndromes. We also discussed genetic testing, including the appropriate family members to test, the process of testing, insurance coverage and turn-around-time for results. We discussed the implications of a negative, positive and/or variant of uncertain significant result. We recommended Tina Wallace pursue genetic testing for the [facility] Multi-Cancer+RNA gene panel.   Based on Tina Wallace's family history of cancer, she meets medical criteria for genetic testing. Despite that she meets criteria, she may still have an out of pocket cost. We discussed that if her out of pocket cost for testing is over $100, the laboratory will call and confirm whether she wants to proceed with testing.  If the out of pocket cost of testing is less than $100 she will be billed by the genetic testing laboratory.   PLAN: After considering the risks, benefits, and limitations, Tina Wallace provided informed consent to pursue genetic testing and the blood sample was sent to [facility] for analysis of the Multi-Cancer+RNA panel. Results should be available within approximately 2-3 weeks' time, at which point they will be disclosed by  telephone to [name], as will any additional recommendations warranted by these results. Tina Wallace will receive a summary of her genetic counseling visit and a copy of her results once available. This information will also be available in Epic.   Ms. Nesseth questions were answered to her satisfaction today. Our contact information was provided should additional questions or concerns arise. Thank you for the referral and allowing [place] to share in the care of your patient.   Faith Rogue, MS, [facility] Genetic Counselor [email or web address] Phone: [phone]  The patient was seen for a total of 30 minutes in face-to-face genetic counseling.  Dr. Grayland Ormond was available for discussion regarding this case.   _______________________________________________________________________ For Office Staff:  Number of people involved in session: 1 Was an Intern/ student involved with case: no

## 2022-06-06 ENCOUNTER — Telehealth: Payer: Self-pay | Admitting: Licensed Clinical Social Worker

## 2022-06-06 ENCOUNTER — Ambulatory Visit: Payer: Self-pay | Admitting: Licensed Clinical Social Worker

## 2022-06-06 ENCOUNTER — Encounter: Payer: Self-pay | Admitting: Licensed Clinical Social Worker

## 2022-06-06 DIAGNOSIS — Z1379 Encounter for other screening for genetic and chromosomal anomalies: Secondary | ICD-10-CM | POA: Insufficient documentation

## 2022-06-06 NOTE — Telephone Encounter (Signed)
I contacted Tina Wallace to discuss her genetic testing results. No pathogenic variants were identified in the 70 genes analyzed. Detailed clinic note to follow.   The test report has been scanned into EPIC and is located under the Molecular Pathology section of the Results Review tab.  A portion of the result report is included below for reference.      Faith Rogue, MS, [facility] Genetic Counselor [email or web address] Phone: [phone]

## 2022-06-06 NOTE — Progress Notes (Signed)
HPI:   [name] was previously seen in the [place] clinic due to a family history of cancer and concerns regarding a hereditary predisposition to cancer. Please refer to our prior cancer genetics clinic note for more information regarding our discussion, assessment and recommendations, at the time. Tina Wallace recent genetic test results were disclosed to Tina Wallace, as were recommendations warranted by these results. These results and recommendations are discussed in more detail below.  CANCER HISTORY:  Oncology History   No history exists.    FAMILY HISTORY:  We obtained a detailed, 4-generation family history.  Significant diagnoses are listed below: Family History  Problem Relation Age of Onset   Breast cancer Mother 70   Cancer Mother        cervical/uterine dx 25s; hysterectomy   COPD Father    Cancer Paternal Aunt        possibly stomach, d. 74s   Stomach cancer Paternal Grandfather        d. 47s   Tina Wallace has 1 daughter, 33 and 2 sons, 42 and 56. She has 2 brothers. One has had skin cancer in the past, basal cell carcinoma.    Tina Wallace mother had cervical/uterine cancer in Tina Wallace 42s and had a hysterectomy. She then had breast cancer at 63 and is currently living at 89. No other known cancers on this side of the family.   Tina Wallace's father died at 47. He had a sister who had cancer and died in Tina Wallace 48s, possibly stomach cancer. Paternal grandfather had stomach cancer and died in his 59s.    Tina Wallace is unaware of previous family history of genetic testing for hereditary cancer risks. There is no reported Ashkenazi Jewish ancestry. There is no known consanguinity.     GENETIC TEST RESULTS:  The [facility] Multi-Cancer+RNA Panel found no pathogenic mutations.   The Multi-Cancer + RNA Panel offered by [facility] includes sequencing and/or deletion/duplication analysis of the following 70 genes:  AIP*, ALK, APC*, ATM*, AXIN2*, BAP1*, BARD1*, BLM*,  BMPR1A*, BRCA1*, BRCA2*, BRIP1*, CDC73*, CDH1*, CDK4, CDKN1B*, CDKN2A, CHEK2*, CTNNA1*, DICER1*, EPCAM, EGFR, FH*, FLCN*, GREM1, HOXB13, KIT, LZTR1, MAX*, MBD4, MEN1*, MET, MITF, MLH1*, MSH2*, MSH3*, MSH6*, MUTYH*, NF1*, NF2*, NTHL1*, PALB2*, PDGFRA, PMS2*, POLD1*, POLE*, POT1*, PRKAR1A*, PTCH1*, PTEN*, RAD51C*, RAD51D*, RB1*, RET, SDHA*, SDHAF2*, SDHB*, SDHC*, SDHD*, SMAD4*, SMARCA4*, SMARCB1*, SMARCE1*, STK11*, SUFU*, TMEM127*, TP53*, TSC1*, TSC2*, VHL*. RNA analysis is performed for * genes..   The test report has been scanned into EPIC and is located under the Molecular Pathology section of the Results Review tab.  A portion of the result report is included below for reference. Genetic testing reported out on 06/05/2022.      Even though a pathogenic variant was not identified, possible explanations for the cancer in the family may include: There may be no hereditary risk for cancer in the family. The cancers in Tina Wallace and/or Tina Wallace family may be sporadic/familial or due to other genetic and environmental factors. There may be a gene mutation in one of these genes that current testing methods cannot detect but that chance is small. There could be another gene that has not yet been discovered, or that we have not yet tested, that is responsible for the cancer diagnoses in the family.  It is also possible there is a hereditary cause for the cancer in the family that Tina Wallace did not inherit.  Therefore, it is important to remain in touch with cancer genetics in the future  so that we can continue to offer Tina Wallace the most up to date genetic testing.   ADDITIONAL GENETIC TESTING:  We discussed with Tina Wallace that Tina Wallace genetic testing was fairly extensive.  If there are additional relevant genes identified to increase cancer risk that can be analyzed in the future, we would be happy to discuss and coordinate this testing at that time.    CANCER SCREENING RECOMMENDATIONS:  Ms.  Wallace test result is considered negative (normal).  This means that we have not identified a hereditary cause for Tina Wallace family history of cancer at this time.   An individual's cancer risk and medical management are not determined by genetic test results alone. Overall cancer risk assessment incorporates additional factors, including personal medical history, family history, and any available genetic information that may result in a personalized plan for cancer prevention and surveillance. Therefore, it is recommended she continue to follow the cancer management and screening guidelines provided by Tina Wallace primary healthcare provider.  RECOMMENDATIONS FOR FAMILY MEMBERS:   Since she did not inherit a identifiable mutation in a cancer predisposition gene included on this panel, Tina Wallace children could not have inherited a known mutation from Tina Wallace in one of these genes. Individuals in this family might be at some increased risk of developing cancer, over the general population risk, due to the family history of cancer.  Individuals in the family should notify their providers of the family history of cancer. We recommend women in this family have a yearly mammogram beginning at age 55, or 80 years younger than the earliest onset of cancer, an annual clinical breast exam, and perform monthly breast self-exams.  Family members should have colonoscopies by at age 78, or earlier, as recommended by their providers.  FOLLOW-UP:  Lastly, we discussed with Tina Wallace that cancer genetics is a rapidly advancing field and it is possible that new genetic tests will be appropriate for Tina Wallace and/or Tina Wallace family members in the future. We encouraged Tina Wallace to remain in contact with cancer genetics on an annual basis so we can update Tina Wallace personal and family histories and let Tina Wallace know of advances in cancer genetics that may benefit this family.   Our contact number was provided. Tina Wallace questions were answered to Tina Wallace  satisfaction, and she knows she is welcome to call us at anytime with additional questions or concerns.    Faith Rogue, MS, [facility] Genetic Counselor [email or web address] Phone: [phone]

## 2022-11-17 DIAGNOSIS — I4719 Other supraventricular tachycardia: Secondary | ICD-10-CM | POA: Insufficient documentation

## 2022-11-17 DIAGNOSIS — E785 Hyperlipidemia, unspecified: Secondary | ICD-10-CM | POA: Insufficient documentation

## 2022-11-17 DIAGNOSIS — I10 Essential (primary) hypertension: Secondary | ICD-10-CM | POA: Insufficient documentation

## 2022-11-17 NOTE — Progress Notes (Unsigned)
Cardiology Office Note  Date:  11/18/2022   ID:  ADAH Wallace, DOB Nov 11, 1960, MRN [id number]  PCP:  Danella Penton, MD   Chief Complaint  Patient presents with   New patient establishing care.     Patient transferring cardiac care from [facility] cardiology.  Patient denies new or acute cardiac problems/concerns today.      HPI:  [name] is a 62 year old woman with past medical history of AVNRT reentrant tachycardia, history of ablation, 2005 Hypertension Hyperlipidemia Who presents to establish care in the [facility] office for her tachycardia.  Follow-up today reports she is doing well Denies significant paroxysmal tachycardia Has done well on lowered dose of metoprolol succinate 25 daily following ablation in 2005  BP elevated today, daughter-in-law has been admitted to the hospital to deliver first grandchild Blood and improvement of blood pressure on recheck Typically blood pressure at home 120s/70  Echocardiogram 2018 NORMAL LEFT VENTRICULAR SYSTOLIC FUNCTION WITH MILD LVH NORMAL RIGHT VENTRICULAR SYSTOLIC FUNCTION MILD VALVULAR REGURGITATION (See above) NO VALVULAR STENOSIS EF 50%   Lab work reviewed Total cholesterol 193 LDL 110 A1c 5.4  Family history Mother with PAD: carotid stenosis quiring stenting  EKG personally reviewed by myself on todays visit EKG Interpretation Date/Time:  Tuesday November 18 2022 09:47:02 EDT Ventricular Rate:  76 PR Interval:  156 QRS Duration:  78 QT Interval:  392 QTC Calculation: 441 R Axis:   -10  Text Interpretation: Normal sinus rhythm Normal ECG When compared with ECG of 18-Sep-2017 21:08, No significant change was found Confirmed by Julien Nordmann [phone]) on 11/18/2022 9:52:33 AM   PMH:   has a past medical history of Anemia, Cancer (HCC) (1985), dysplastic nevus (10/06/2007), Melanoma (HCC) (1985), SVT (supraventricular tachycardia), Vitreous hemorrhage, left eye (HCC), and Wears glasses.  PSH:    Past Surgical  History:  Procedure Laterality Date   ABLATION     for SVT   BREAST BIOPSY Left 12/11/11   bx/clip-neg- Benign Breast Tissue   BREAST CYST ASPIRATION Right 03/07/00   neg   CARDIAC CATHETERIZATION     2005 at [facility] SECTION     x 2   CHOLECYSTECTOMY     COLONOSCOPY W/ BIOPSIES AND POLYPECTOMY     EYE SURGERY     eye laser sx B/L in MD office   GAS/FLUID EXCHANGE Left 03/23/2018   Procedure: GAS/FLUID EXCHANGE LEFT EYE;  Surgeon: Sherrie George, MD;  Location: [facility] OR;  Service: Ophthalmology;  Laterality: Left;   LASER PHOTO ABLATION Left 03/23/2018   Procedure: LASER PHOTO ABLATION LEFT EYE;  Surgeon: Sherrie George, MD;  Location: [facility] OR;  Service: Ophthalmology;  Laterality: Left;   PARS PLANA VITRECTOMY 27 GAUGE Left 03/23/2018   Procedure: PARS PLANA VITRECTOMY 27 GAUGE LEFT EYE;  Surgeon: Sherrie George, MD;  Location: [facility] OR;  Service: Ophthalmology;  Laterality: Left;    Current Outpatient Medications  Medication Sig Dispense Refill   cholecalciferol (VITAMIN D3) 25 MCG (1000 UNIT) tablet Take 1,000 Units by mouth daily.     famotidine (PEPCID) 20 MG tablet Take 40 mg by mouth daily.     magnesium oxide (MAG-OX) 400 (240 Mg) MG tablet Take 400 mg by mouth daily.     meclizine (ANTIVERT) 25 MG tablet Take 25 mg by mouth 2 (two) times daily as needed.     metoprolol succinate (TOPROL-XL) 25 MG 24 hr tablet Take 25 mg by mouth daily.  3   simvastatin (ZOCOR)  10 MG tablet Take 10 mg by mouth at bedtime.  3   bacitracin-polymyxin b (POLYSPORIN) ophthalmic ointment Place 1 application into the left eye 3 (three) times daily. apply to eye every 12 hours while awake (Patient not taking: Reported on 11/18/2022) 3.5 g 0   Cholecalciferol (VITAMIN D3) 50 MCG (2000 UT) capsule Take 2,000 Units by mouth 2 (two) times a week. (Patient not taking: Reported on 11/18/2022)     gatifloxacin (ZYMAXID) 0.5 % SOLN Place 1 drop into the left eye 4 (four) times daily. (Patient not taking:  Reported on 11/18/2022)     pantoprazole (PROTONIX) 40 MG tablet Take 1 tablet (40 mg total) by mouth daily. (Patient not taking: Reported on 11/18/2022) 30 tablet 0   prednisoLONE acetate (PRED FORTE) 1 % ophthalmic suspension Place 1 drop into the left eye 4 (four) times daily. (Patient not taking: Reported on 11/18/2022) 5 mL 0   sucralfate (CARAFATE) 1 GM/10ML suspension Take 10 mLs (1 g total) by mouth 4 (four) times daily. (Patient not taking: Reported on 11/18/2022) 420 mL 1   No current facility-administered medications for this visit.     Allergies:   Codeine and Percocet [oxycodone-acetaminophen]   Social History:  The patient  reports that she has never smoked. She has never used smokeless tobacco. She reports that she does not drink alcohol and does not use drugs.   Family History:   family history includes Breast cancer (age of onset: 55) in her mother; COPD in her father; Cancer in her mother and paternal aunt; Stomach cancer in her paternal grandfather.    Review of Systems: Review of Systems  Constitutional: Negative.   HENT: Negative.    Respiratory: Negative.    Cardiovascular: Negative.   Gastrointestinal: Negative.   Musculoskeletal: Negative.   Neurological: Negative.   Psychiatric/Behavioral: Negative.    All other systems reviewed and are negative.   PHYSICAL EXAM: VS:  BP (!) 148/92 (BP Location: Right Arm, Patient Position: Sitting, Cuff Size: Normal)   Pulse 76   Ht 5\' 3"  (1.6 m)   Wt 144 lb 9.6 oz (65.6 kg)   SpO2 98%   BMI 25.61 kg/m  , BMI Body mass index is 25.61 kg/m. GEN: Well nourished, well developed, in no acute distress HEENT: normal Neck: no JVD, carotid bruits, or masses Cardiac: RRR; no murmurs, rubs, or gallops,no edema  Respiratory:  clear to auscultation bilaterally, normal work of breathing GI: soft, nontender, nondistended, + BS MS: no deformity or atrophy Skin: warm and dry, no rash Neuro:  Strength and sensation are  intact Psych: euthymic mood, full affect   Recent Labs: No results found for requested labs within last 365 days.    Lipid Panel No results found for: "CHOL", "HDL", "LDLCALC", "TRIG"    Wt Readings from Last 3 Encounters:  11/18/22 144 lb 9.6 oz (65.6 kg)  03/23/18 135 lb (61.2 kg)  09/18/17 138 lb (62.6 kg)     ASSESSMENT AND PLAN:  Problem List Items Addressed This Visit       Cardiology Problems   AVNRT (AV nodal re-entry tachycardia) - Primary   Relevant Orders   EKG 12-Lead (Completed)   Essential hypertension   Hyperlipidemia   Paroxysmal tachycardia/SVT Prior ablation 2005, has done well since that time Continue metoprolol succinate 25 daily Reports having ectopy for which she takes magnesium, likely PVCs  Hyperlipidemia On simvastatin 10 daily Coronary calcium scoring ordered for risk stratification given strong family history mother with carotid  stenosis and stent  Essential hypertension On metoprolol blood pressure typically well-controlled at home No changes to medications, will continue to monitor    Total encounter time more than 50 minutes  Greater than 50% was spent in counseling and coordination of care with the patient    Signed, Dossie Arbour, M.D., Ph.D. [facility] [place], [place] [phone]

## 2022-11-18 ENCOUNTER — Encounter: Payer: Self-pay | Admitting: Cardiovascular Disease

## 2022-11-18 ENCOUNTER — Ambulatory Visit: Payer: BC Managed Care – PPO | Admitting: Cardiovascular Disease

## 2022-11-18 VITALS — BP 140/80 | HR 76 | Ht 63.0 in | Wt 144.6 lb

## 2022-11-18 DIAGNOSIS — I10 Essential (primary) hypertension: Secondary | ICD-10-CM | POA: Diagnosis not present

## 2022-11-18 DIAGNOSIS — I4719 Other supraventricular tachycardia: Secondary | ICD-10-CM | POA: Diagnosis not present

## 2022-11-18 DIAGNOSIS — E782 Mixed hyperlipidemia: Secondary | ICD-10-CM | POA: Diagnosis not present

## 2022-11-18 NOTE — Patient Instructions (Addendum)
Medication Instructions:  No changes  If you need a refill on your cardiac medications before your next appointment, please call your pharmacy.   Lab work: No new labs needed  Testing/Procedures:  CT coronary calcium score.   - $99 out of pocket cost at the time of your test - Call [phone] to schedule at your convenience.  [facility] [address] [place], [place] [address]   Coronary CalciumScan A coronary calcium scan is an imaging test used to look for deposits of calcium and other fatty materials (plaques) in the inner lining of the blood vessels of the heart (coronary arteries). These deposits of calcium and plaques can partly clog and narrow the coronary arteries without producing any symptoms or warning signs. This puts a person at risk for a heart attack. This test can detect these deposits before symptoms develop. Tell a health care provider about: Any allergies you have. All medicines you are taking, including vitamins, herbs, eye drops, creams, and over-the-counter medicines. Any problems you or family members have had with anesthetic medicines. Any blood disorders you have. Any surgeries you have had. Any medical conditions you have. Whether you are pregnant or may be pregnant. What are the risks? Generally, this is a safe procedure. However, problems may occur, including: Harm to a pregnant woman and her unborn baby. This test involves the use of radiation. Radiation exposure can be dangerous to a pregnant woman and her unborn baby. If you are pregnant, you generally should not have this procedure done. Slight increase in the risk of cancer. This is because of the radiation involved in the test. What happens before the procedure? No preparation is needed for this procedure. What happens during the procedure? You will undress and remove any jewelry around your neck or chest. You will put on a hospital gown. Sticky  electrodes will be placed on your chest. The electrodes will be connected to an electrocardiogram (ECG) machine to record a tracing of the electrical activity of your heart. A CT scanner will take pictures of your heart. During this time, you will be asked to lie still and hold your breath for 2-3 seconds while a picture of your heart is being taken. The procedure may vary among health care providers and hospitals. What happens after the procedure? You can get dressed. You can return to your normal activities. It is up to you to get the results of your test. Ask your health care provider, or the department that is doing the test, when your results will be ready. Summary A coronary calcium scan is an imaging test used to look for deposits of calcium and other fatty materials (plaques) in the inner lining of the blood vessels of the heart (coronary arteries). Generally, this is a safe procedure. Tell your health care provider if you are pregnant or may be pregnant. No preparation is needed for this procedure. A CT scanner will take pictures of your heart. You can return to your normal activities after the scan is done. This information is not intended to replace advice given to you by your health care provider. Make sure you discuss any questions you have with your health care provider. Document Released: 09/20/2007 Document Revised: 02/11/2016 Document Reviewed: 02/11/2016 Elsevier Interactive Patient Education  2017 [employer].   Follow-Up: At [facility], you and your health needs are our priority.  As part of our continuing mission to provide you with exceptional heart care, we have created designated  Provider Care Teams.  These Care Teams include your primary Cardiologist (physician) and Advanced Practice Providers (APPs -  Physician Assistants and Nurse Practitioners) who all work together to provide you with the care you need, when you need it.  You will need a follow up appointment in  12 months  Providers on your designated Care Team:   Nicolasa Ducking, NP Eula Listen, PA-C Cadence Fransico Michael, [place]  COVID-19 Vaccine Information can be found at: [email or web address] For questions related to vaccine distribution or appointments, please email [email or web address] or call [phone].

## 2022-11-21 ENCOUNTER — Ambulatory Visit
Admission: RE | Admit: 2022-11-21 | Discharge: 2022-11-21 | Disposition: A | Discharge status: Home / Self Care | Payer: BC Managed Care – PPO | Source: Ambulatory Visit | Attending: Cardiovascular Disease | Admitting: Cardiovascular Disease

## 2022-11-21 DIAGNOSIS — E782 Mixed hyperlipidemia: Secondary | ICD-10-CM | POA: Insufficient documentation

## 2022-12-01 ENCOUNTER — Ambulatory Visit: Payer: BC Managed Care – PPO | Admitting: Dermatology

## 2023-01-06 ENCOUNTER — Ambulatory Visit: Payer: BC Managed Care – PPO | Admitting: Dermatology

## 2023-02-25 ENCOUNTER — Other Ambulatory Visit: Payer: Self-pay | Admitting: Obstetrics and Gynecology

## 2023-02-25 DIAGNOSIS — Z1231 Encounter for screening mammogram for malignant neoplasm of breast: Secondary | ICD-10-CM

## 2023-03-17 ENCOUNTER — Other Ambulatory Visit: Payer: Self-pay | Admitting: Internal Medicine

## 2023-03-17 DIAGNOSIS — G5792 Unspecified mononeuropathy of left lower limb: Secondary | ICD-10-CM

## 2023-03-17 DIAGNOSIS — R911 Solitary pulmonary nodule: Secondary | ICD-10-CM

## 2023-03-17 DIAGNOSIS — M5116 Intervertebral disc disorders with radiculopathy, lumbar region: Secondary | ICD-10-CM

## 2023-03-18 ENCOUNTER — Ambulatory Visit
Admission: RE | Admit: 2023-03-18 | Discharge: 2023-03-18 | Disposition: A | Discharge status: Home / Self Care | Payer: BC Managed Care – PPO | Source: Ambulatory Visit | Attending: Internal Medicine | Admitting: Internal Medicine

## 2023-03-18 DIAGNOSIS — M5116 Intervertebral disc disorders with radiculopathy, lumbar region: Secondary | ICD-10-CM | POA: Insufficient documentation

## 2023-03-23 ENCOUNTER — Ambulatory Visit
Admission: RE | Admit: 2023-03-23 | Discharge: 2023-03-23 | Disposition: A | Discharge status: Home / Self Care | Payer: BC Managed Care – PPO | Source: Ambulatory Visit | Attending: Internal Medicine | Admitting: Internal Medicine

## 2023-03-23 DIAGNOSIS — G5792 Unspecified mononeuropathy of left lower limb: Secondary | ICD-10-CM | POA: Diagnosis present

## 2023-03-23 DIAGNOSIS — R911 Solitary pulmonary nodule: Secondary | ICD-10-CM | POA: Diagnosis present

## 2023-04-21 ENCOUNTER — Ambulatory Visit
Admission: RE | Admit: 2023-04-21 | Discharge: 2023-04-21 | Disposition: A | Discharge status: Home / Self Care | Payer: 59 | Source: Ambulatory Visit | Attending: Obstetrics and Gynecology | Admitting: Obstetrics and Gynecology

## 2023-04-21 DIAGNOSIS — Z1231 Encounter for screening mammogram for malignant neoplasm of breast: Secondary | ICD-10-CM | POA: Insufficient documentation

## 2023-04-23 ENCOUNTER — Ambulatory Visit: Payer: BC Managed Care – PPO | Admitting: Dermatology

## 2023-05-06 ENCOUNTER — Encounter: Payer: Self-pay | Admitting: Dermatology

## 2023-05-06 ENCOUNTER — Ambulatory Visit: Payer: 59 | Admitting: Dermatology

## 2023-05-06 DIAGNOSIS — L578 Other skin changes due to chronic exposure to nonionizing radiation: Secondary | ICD-10-CM | POA: Diagnosis not present

## 2023-05-06 DIAGNOSIS — D229 Melanocytic nevi, unspecified: Secondary | ICD-10-CM

## 2023-05-06 DIAGNOSIS — W908XXA Exposure to other nonionizing radiation, initial encounter: Secondary | ICD-10-CM | POA: Diagnosis not present

## 2023-05-06 DIAGNOSIS — D692 Other nonthrombocytopenic purpura: Secondary | ICD-10-CM

## 2023-05-06 DIAGNOSIS — L820 Inflamed seborrheic keratosis: Secondary | ICD-10-CM

## 2023-05-06 DIAGNOSIS — Z1283 Encounter for screening for malignant neoplasm of skin: Secondary | ICD-10-CM

## 2023-05-06 DIAGNOSIS — L814 Other melanin hyperpigmentation: Secondary | ICD-10-CM

## 2023-05-06 DIAGNOSIS — L853 Xerosis cutis: Secondary | ICD-10-CM

## 2023-05-06 DIAGNOSIS — Z85820 Personal history of malignant melanoma of skin: Secondary | ICD-10-CM

## 2023-05-06 DIAGNOSIS — L720 Epidermal cyst: Secondary | ICD-10-CM

## 2023-05-06 DIAGNOSIS — D1801 Hemangioma of skin and subcutaneous tissue: Secondary | ICD-10-CM

## 2023-05-06 DIAGNOSIS — L821 Other seborrheic keratosis: Secondary | ICD-10-CM

## 2023-05-06 DIAGNOSIS — Z86018 Personal history of other benign neoplasm: Secondary | ICD-10-CM

## 2023-05-06 NOTE — Progress Notes (Signed)
Follow-Up Visit   Subjective  Tina Wallace is a 63 y.o. female who presents for the following: Skin Cancer Screening and Full Body Skin Exam. Hx of dysplastic nevus. Hx of melanoma.   The patient presents for Total-Body Skin Exam (TBSE) for skin cancer screening and mole check. The patient has spots, moles and lesions to be evaluated, some may be new or changing and the patient may have concern these could be cancer.    The following portions of the chart were reviewed this encounter and updated as appropriate: medications, allergies, medical history  Review of Systems:  No other skin or systemic complaints except as noted in HPI or Assessment and Plan.  Objective  Well appearing patient in no apparent distress; mood and affect are within normal limits.  A full examination was performed including scalp, head, eyes, ears, nose, lips, neck, chest, axillae, abdomen, back, buttocks, bilateral upper extremities, bilateral lower extremities, hands, feet, fingers, toes, fingernails, and toenails. All findings within normal limits unless otherwise noted below.   Relevant physical exam findings are noted in the Assessment and Plan.  Right Shoulder - Posterior Erythematous keratotic or waxy stuck-on papule  Assessment & Plan   HISTORY OF MELANOMA. Left medial breast. 1985. - No evidence of recurrence today - Recommend regular full body skin exams - Recommend daily broad spectrum sunscreen SPF 30+ to sun-exposed areas, reapply every 2 hours as needed.  - Call if any new or changing lesions are noted between office visits   HISTORY OF DYSPLASTIC NEVUS. Right posterior flank. Slight atypia. 10/06/2007. No evidence of recurrence today Recommend regular full body skin exams Recommend daily broad spectrum sunscreen SPF 30+ to sun-exposed areas, reapply every 2 hours as needed.  Call if any new or changing lesions are noted between office visits   SKIN CANCER SCREENING PERFORMED  TODAY.  ACTINIC DAMAGE. Chest - Chronic condition, secondary to cumulative UV/sun exposure - diffuse scaly erythematous macules with underlying dyspigmentation - Recommend daily broad spectrum sunscreen SPF 30+ to sun-exposed areas, reapply every 2 hours as needed.  - Staying in the shade or wearing long sleeves, sun glasses (UVA+UVB protection) and wide brim hats (4-inch brim around the entire circumference of the hat) are also recommended for sun protection.  - Call for new or changing lesions.  LENTIGINES at torso, arms, face, SEBORRHEIC KERATOSES, HEMANGIOMAS - Benign normal skin lesions. Torso. - Benign-appearing - Call for any changes  MELANOCYTIC NEVI - Tan-brown and/or pink-flesh-colored symmetric macules and papules - Benign appearing on exam today - Observation - Call clinic for new or changing moles - Recommend daily use of broad spectrum spf 30+ sunscreen to sun-exposed areas.    Purpura - Chronic; persistent and recurrent.  Treatable, but not curable. - Violaceous macules and patches at arms - Benign - Related to trauma, age, sun damage and/or use of blood thinners, chronic use of topical and/or oral steroids - Observe - Can use OTC arnica containing moisturizer such as Dermend Bruise Formula if desired - Call for worsening or other concerns  Xerosis - diffuse xerotic patches at back - recommend gentle, hydrating skin care - gentle skin care handout given  Milia - tiny firm white papule at chin - type of cyst - benign - sometimes these will clear with nightly OTC adapalene/Differin 0.1% gel or retinol. - may be extracted if symptomatic - observe   INFLAMED SEBORRHEIC KERATOSIS Right Shoulder - Posterior Reassured benign age-related growth.  Recommend observation.  Discussed cryotherapy if spot(s)  become irritated or inflamed.   Patient deferred treatment at this time.  Return in about 1 year (around 05/05/2024) for TBSE, HxMM, HxDN, With Dr.  Gwen Pounds.  I, Lawson Radar, CMA, am acting as scribe for Willeen Niece, MD.   Documentation: I have reviewed the above documentation for accuracy and completeness, and I agree with the above.  Willeen Niece, MD

## 2023-05-06 NOTE — Patient Instructions (Addendum)
Recommend daily broad spectrum sunscreen SPF 30+ to sun-exposed areas, reapply every 2 hours as needed. Call for new or changing lesions.  Staying in the shade or wearing long sleeves, sun glasses (UVA+UVB protection) and wide brim hats (4-inch brim around the entire circumference of the hat) are also recommended for sun protection.      Melanoma ABCDEs  Melanoma is the most dangerous type of skin cancer, and is the leading cause of death from skin disease.  You are more likely to develop melanoma if you: Have light-colored skin, light-colored eyes, or red or blond hair Spend a lot of time in the sun Tan regularly, either outdoors or in a tanning bed Have had blistering sunburns, especially during childhood Have a close family member who has had a melanoma Have atypical moles or large birthmarks  Early detection of melanoma is key since treatment is typically straightforward and cure rates are extremely high if we catch it early.   The first sign of melanoma is often a change in a mole or a new dark spot.  The ABCDE system is a way of remembering the signs of melanoma.  A for asymmetry:  The two halves do not match. B for border:  The edges of the growth are irregular. C for color:  A mixture of colors are present instead of an even brown color. D for diameter:  Melanomas are usually (but not always) greater than 6mm - the size of a pencil eraser. E for evolution:  The spot keeps changing in size, shape, and color.  Please check your skin once per month between visits. You can use a small mirror in front and a large mirror behind you to keep an eye on the back side or your body.   If you see any new or changing lesions before your next follow-up, please call to schedule a visit.  Please continue daily skin protection including broad spectrum sunscreen SPF 30+ to sun-exposed areas, reapplying every 2 hours as needed when you're outdoors.   Staying in the shade or wearing long sleeves,  sun glasses (UVA+UVB protection) and wide brim hats (4-inch brim around the entire circumference of the hat) are also recommended for sun protection.      Gentle Skin Care Guide  1. Bathe no more than once a day.  2. Avoid bathing in hot water  3. Use a mild soap like Dove, Vanicream, Cetaphil, CeraVe. Can use Lever 2000 or Cetaphil antibacterial soap  4. Use soap only where you need it. On most days, use it under your arms, between your legs, and on your feet. Let the water rinse other areas unless visibly dirty.  5. When you get out of the bath/shower, use a towel to gently blot your skin dry, don't rub it.  6. While your skin is still a little damp, apply a moisturizing cream such as Vanicream, CeraVe, Cetaphil, Eucerin, Sarna lotion or plain Vaseline Jelly. For hands apply Neutrogena [place] Hand Cream or Excipial Hand Cream.  7. Reapply moisturizer any time you start to itch or feel dry.  8. Sometimes using free and clear laundry detergents can be helpful. Fabric softener sheets should be avoided. Downy Free & Gentle liquid, or any liquid fabric softener that is free of dyes and perfumes, it acceptable to use  9. If your doctor has given you prescription creams you may apply moisturizers over them       Due to recent changes in healthcare laws, you may see results  of your pathology and/or laboratory studies on MyChart before the doctors have had a chance to review them. We understand that in some cases there may be results that are confusing or concerning to you. Please understand that not all results are received at the same time and often the doctors may need to interpret multiple results in order to provide you with the best plan of care or course of treatment. Therefore, we ask that you please give [place] 2 business days to thoroughly review all your results before contacting the office for clarification. Should we see a critical lab result, you will be contacted sooner.   If  You Need Anything After Your Visit  If you have any questions or concerns for your doctor, please call our main line at [phone] and press option 4 to reach your doctor's medical assistant. If no one answers, please leave a voicemail as directed and we will return your call as soon as possible. Messages left after 4 pm will be answered the following business day.   You may also send [place] a message via MyChart. We typically respond to MyChart messages within 1-2 business days.  For prescription refills, please ask your pharmacy to contact our office. Our fax number is [phone].  If you have an urgent issue when the clinic is closed that cannot wait until the next business day, you can page your doctor at the number below.    Please note that while we do our best to be available for urgent issues outside of office hours, we are not available 24/7.   If you have an urgent issue and are unable to reach [place], you may choose to seek medical care at your doctor's office, retail clinic, urgent care center, or emergency room.  If you have a medical emergency, please immediately call 911 or go to the emergency department.  Pager Numbers  - Dr. Gwen Pounds: [phone]  - Dr. Roseanne Reno: [phone]  - Dr. Katrinka Blazing: [phone]   In the event of inclement weather, please call our main line at [phone] for an update on the status of any delays or closures.  Dermatology Medication Tips: Please keep the boxes that topical medications come in in order to help keep track of the instructions about where and how to use these. Pharmacies typically print the medication instructions only on the boxes and not directly on the medication tubes.   If your medication is too expensive, please contact our office at [phone] option 4 or send [place] a message through MyChart.   We are unable to tell what your co-pay for medications will be in advance as this is different depending on your insurance coverage.  However, we may be able to find a substitute medication at lower cost or fill out paperwork to get insurance to cover a needed medication.   If a prior authorization is required to get your medication covered by your insurance company, please allow [place] 1-2 business days to complete this process.  Drug prices often vary depending on where the prescription is filled and some pharmacies may offer cheaper prices.  The website www.goodrx.com contains coupons for medications through different pharmacies. The prices here do not account for what the cost may be with help from insurance (it may be cheaper with your insurance), but the website can give you the price if you did not use any insurance.  - You can print the associated coupon and take it with your prescription to the pharmacy.  - You may  also stop by our office during regular business hours and pick up a GoodRx coupon card.  - If you need your prescription sent electronically to a different pharmacy, notify our office through [facility] or by phone at [phone] option 4.     Si Usted Necesita Algo Despus de Su Visita  Tambin puede enviarnos un mensaje a travs de Clinical cytogeneticist. Por lo general respondemos a los mensajes de MyChart en el transcurso de 1 a 2 das hbiles.  Para renovar recetas, por favor pida a su farmacia que se ponga en contacto con nuestra oficina. Annie Sable de fax es [place] [phone].  Si tiene un asunto urgente cuando la clnica est cerrada y que no puede esperar hasta el siguiente da hbil, puede llamar/localizar a su doctor(a) al nmero que aparece a continuacin.   Por favor, tenga en cuenta que aunque hacemos todo lo posible para estar disponibles para asuntos urgentes fuera del horario de [place], no estamos disponibles las 24 horas del da, los 7 [address]  [address] de la [place].   Si tiene un problema urgente y no puede comunicarse con nosotros, puede optar por buscar atencin mdica  en el consultorio de su  doctor(a), en una clnica privada, en un centro de atencin urgente o en una sala de emergencias.  Si tiene Engineer, drilling, por favor llame inmediatamente al 911 o vaya a la sala de emergencias.  Nmeros de bper  - Dr. Gwen Pounds: [phone]  - Dra. Roseanne Reno: [phone]  - Dr. Katrinka Blazing: [phone]   En caso de inclemencias del tiempo, por favor llame a Lacy Duverney principal al [phone] para una actualizacin sobre el [place] de cualquier retraso o cierre.  Consejos para la medicacin en dermatologa: Por favor, guarde las cajas en las que vienen los medicamentos de uso tpico para ayudarle a seguir las instrucciones sobre dnde y cmo usarlos. Las farmacias generalmente imprimen las instrucciones del medicamento slo en las cajas y no directamente en los tubos del [place].   Si su medicamento es muy caro, por favor, pngase en contacto con Rolm Gala llamando al [phone] y presione la opcin 4 o envenos un mensaje a travs de Clinical cytogeneticist.   No podemos decirle cul ser su copago por los medicamentos por adelantado ya que esto es diferente dependiendo de la cobertura de su seguro. Sin embargo, es posible que podamos encontrar un medicamento sustituto a Audiological scientist un formulario para que el seguro cubra el medicamento que se considera necesario.   Si se requiere una autorizacin previa para que su compaa de seguros [place] su medicamento, por favor permtanos de 1 a 2 das hbiles para completar [address]  Los precios de los medicamentos varan con frecuencia dependiendo del Environmental consultant de dnde se surte la receta y alguna farmacias pueden ofrecer precios ms baratos.  El sitio web www.goodrx.com tiene cupones para medicamentos de Health and safety inspector. Los precios aqu no tienen en cuenta lo que podra costar con la ayuda del seguro (puede ser ms barato con su seguro), pero el sitio web puede darle el precio si no utiliz Tourist information centre manager.  - Puede imprimir el  cupn correspondiente y llevarlo con su receta a la farmacia.  - Tambin puede pasar por nuestra oficina durante el horario de atencin regular y Education officer, museum una tarjeta de cupones de GoodRx.  - Si necesita que su receta se enve electrnicamente a Psychiatrist, informe a nuestra oficina a travs de MyChart de [place] o por telfono llamando al [phone] y presione  la opcin 4.

## 2023-06-04 ENCOUNTER — Other Ambulatory Visit: Payer: Self-pay | Admitting: Internal Medicine

## 2023-06-04 DIAGNOSIS — R911 Solitary pulmonary nodule: Secondary | ICD-10-CM

## 2023-06-12 ENCOUNTER — Ambulatory Visit
Admission: RE | Admit: 2023-06-12 | Discharge: 2023-06-12 | Disposition: A | Discharge status: Home / Self Care | Source: Ambulatory Visit | Attending: Internal Medicine | Admitting: Internal Medicine

## 2023-06-12 DIAGNOSIS — R911 Solitary pulmonary nodule: Secondary | ICD-10-CM | POA: Diagnosis present

## 2023-07-02 ENCOUNTER — Other Ambulatory Visit: Payer: Self-pay | Admitting: Internal Medicine

## 2023-07-02 DIAGNOSIS — K7689 Other specified diseases of liver: Secondary | ICD-10-CM

## 2023-07-02 DIAGNOSIS — R160 Hepatomegaly, not elsewhere classified: Secondary | ICD-10-CM

## 2023-07-14 ENCOUNTER — Ambulatory Visit
Admission: RE | Admit: 2023-07-14 | Discharge: 2023-07-14 | Disposition: A | Discharge status: Home / Self Care | Source: Ambulatory Visit | Attending: Internal Medicine | Admitting: Internal Medicine

## 2023-07-14 DIAGNOSIS — K7689 Other specified diseases of liver: Secondary | ICD-10-CM | POA: Diagnosis present

## 2023-07-14 DIAGNOSIS — R160 Hepatomegaly, not elsewhere classified: Secondary | ICD-10-CM | POA: Diagnosis present

## 2023-07-14 MED ORDER — GADOBUTROL 1 MMOL/ML IV SOLN
7.0000 mL | Freq: Once | INTRAVENOUS | Status: AC | PRN
  Administered 2023-07-14: 7 mL via INTRAVENOUS

## 2024-01-22 ENCOUNTER — Ambulatory Visit: Payer: Self-pay

## 2024-01-22 DIAGNOSIS — Z83719 Family history of colon polyps, unspecified: Secondary | ICD-10-CM | POA: Diagnosis not present

## 2024-01-22 DIAGNOSIS — Z1211 Encounter for screening for malignant neoplasm of colon: Secondary | ICD-10-CM | POA: Diagnosis present

## 2024-03-23 ENCOUNTER — Other Ambulatory Visit: Payer: Self-pay | Admitting: Surgery

## 2024-04-21 ENCOUNTER — Other Ambulatory Visit

## 2024-04-27 ENCOUNTER — Ambulatory Visit: Admit: 2024-04-27 | Admitting: Surgery

## 2024-04-27 SURGERY — RELEASE, CARPAL TUNNEL, ENDOSCOPIC
Anesthesia: Choice | Site: Wrist | Laterality: Right

## 2024-05-05 ENCOUNTER — Encounter: Payer: Self-pay | Admitting: Dermatology

## 2024-05-05 ENCOUNTER — Ambulatory Visit: Payer: 59 | Admitting: Dermatology

## 2024-05-05 DIAGNOSIS — Z85820 Personal history of malignant melanoma of skin: Secondary | ICD-10-CM

## 2024-05-05 DIAGNOSIS — L821 Other seborrheic keratosis: Secondary | ICD-10-CM | POA: Diagnosis not present

## 2024-05-05 DIAGNOSIS — L2089 Other atopic dermatitis: Secondary | ICD-10-CM

## 2024-05-05 DIAGNOSIS — Z86018 Personal history of other benign neoplasm: Secondary | ICD-10-CM

## 2024-05-05 DIAGNOSIS — L570 Actinic keratosis: Secondary | ICD-10-CM

## 2024-05-05 DIAGNOSIS — L820 Inflamed seborrheic keratosis: Secondary | ICD-10-CM

## 2024-05-05 DIAGNOSIS — Z1283 Encounter for screening for malignant neoplasm of skin: Secondary | ICD-10-CM | POA: Diagnosis not present

## 2024-05-05 DIAGNOSIS — Z7189 Other specified counseling: Secondary | ICD-10-CM

## 2024-05-05 DIAGNOSIS — L853 Xerosis cutis: Secondary | ICD-10-CM

## 2024-05-05 DIAGNOSIS — W908XXA Exposure to other nonionizing radiation, initial encounter: Secondary | ICD-10-CM

## 2024-05-05 DIAGNOSIS — D229 Melanocytic nevi, unspecified: Secondary | ICD-10-CM

## 2024-05-05 DIAGNOSIS — I8393 Asymptomatic varicose veins of bilateral lower extremities: Secondary | ICD-10-CM | POA: Diagnosis not present

## 2024-05-05 DIAGNOSIS — L578 Other skin changes due to chronic exposure to nonionizing radiation: Secondary | ICD-10-CM

## 2024-05-05 DIAGNOSIS — L814 Other melanin hyperpigmentation: Secondary | ICD-10-CM

## 2024-05-05 DIAGNOSIS — D692 Other nonthrombocytopenic purpura: Secondary | ICD-10-CM | POA: Diagnosis not present

## 2024-05-05 DIAGNOSIS — D1801 Hemangioma of skin and subcutaneous tissue: Secondary | ICD-10-CM

## 2024-05-05 NOTE — Patient Instructions (Addendum)
 Recommend cerave cream moisturizer or cetaphil cream        Gentle Skin Care Guide  1. Bathe no more than once a day.  2. Avoid bathing in hot water   3. Use a mild soap like Dove, Vanicream, Cetaphil, CeraVe. Can use Lever 2000 or Cetaphil antibacterial soap  4. Use soap only where you need it. On most days, use it under your arms, between your legs, and on your feet. Let the water  rinse other areas unless visibly dirty.  5. When you get out of the bath/shower, use a towel to gently blot your skin dry, don't rub it.  6. While your skin is still a little damp, apply a moisturizing cream such as Vanicream, CeraVe, Cetaphil, Eucerin, Sarna lotion or plain Vaseline Jelly. For hands apply Neutrogena [place] Hand Cream or Excipial Hand Cream.  7. Reapply moisturizer any time you start to itch or feel dry.  8. Sometimes using free and clear laundry detergents can be helpful. Fabric softener sheets should be avoided. Downy Free & Gentle liquid, or any liquid fabric softener that is free of dyes and perfumes, it acceptable to use  9. If your doctor has given you prescription creams you may apply moisturizers over them       Actinic keratoses are precancerous spots that appear secondary to cumulative UV radiation exposure/sun exposure over time. They are chronic with expected duration over 1 year. A portion of actinic keratoses will progress to squamous cell carcinoma of the skin. It is not possible to reliably predict which spots will progress to skin cancer and so treatment is recommended to prevent development of skin cancer.  Recommend daily broad spectrum sunscreen SPF 30+ to sun-exposed areas, reapply every 2 hours as needed.  Recommend staying in the shade or wearing long sleeves, sun glasses (UVA+UVB protection) and wide brim hats (4-inch brim around the entire circumference of the hat). Call for new or changing lesions.     Cryotherapy Aftercare  Wash gently with soap and  water  everyday.   Apply Vaseline and Band-Aid daily until healed.    Seborrheic Keratosis  What causes seborrheic keratoses? Seborrheic keratoses are harmless, common skin growths that first appear during adult life.  As time goes by, more growths appear.  Some people may develop a large number of them.  Seborrheic keratoses appear on both covered and uncovered body parts.  They are not caused by sunlight.  The tendency to develop seborrheic keratoses can be inherited.  They vary in color from skin-colored to gray, brown, or even black.  They can be either smooth or have a rough, warty surface.   Seborrheic keratoses are superficial and look as if they were stuck on the skin.  Under the microscope this type of keratosis looks like layers upon layers of skin.  That is why at times the top layer may seem to fall off, but the rest of the growth remains and re-grows.    Treatment Seborrheic keratoses do not need to be treated, but can easily be removed in the office.  Seborrheic keratoses often cause symptoms when they rub on clothing or jewelry.  Lesions can be in the way of shaving.  If they become inflamed, they can cause itching, soreness, or burning.  Removal of a seborrheic keratosis can be accomplished by freezing, burning, or surgery. If any spot bleeds, scabs, or grows rapidly, please return to have it checked, as these can be an indication of a skin cancer.   Melanoma ABCDEs  Melanoma is the most dangerous type of skin cancer, and is the leading cause of death from skin disease.  You are more likely to develop melanoma if you: Have light-colored skin, light-colored eyes, or red or blond hair Spend a lot of time in the sun Tan regularly, either outdoors or in a tanning bed Have had blistering sunburns, especially during childhood Have a close family member who has had a melanoma Have atypical moles or large birthmarks  Early detection of melanoma is key since treatment is typically  straightforward and cure rates are extremely high if we catch it early.   The first sign of melanoma is often a change in a mole or a new dark spot.  The ABCDE system is a way of remembering the signs of melanoma.  A for asymmetry:  The two halves do not match. B for border:  The edges of the growth are irregular. C for color:  A mixture of colors are present instead of an even brown color. D for diameter:  Melanomas are usually (but not always) greater than 6mm - the size of a pencil eraser. E for evolution:  The spot keeps changing in size, shape, and color.  Please check your skin once per month between visits. You can use a small mirror in front and a large mirror behind you to keep an eye on the back side or your body.   If you see any new or changing lesions before your next follow-up, please call to schedule a visit.  Please continue daily skin protection including broad spectrum sunscreen SPF 30+ to sun-exposed areas, reapplying every 2 hours as needed when you're outdoors.   Staying in the shade or wearing long sleeves, sun glasses (UVA+UVB protection) and wide brim hats (4-inch brim around the entire circumference of the hat) are also recommended for sun protection.    Due to recent changes in healthcare laws, you may see results of your pathology and/or laboratory studies on MyChart before the doctors have had a chance to review them. We understand that in some cases there may be results that are confusing or concerning to you. Please understand that not all results are received at the same time and often the doctors may need to interpret multiple results in order to provide you with the best plan of care or course of treatment. Therefore, we ask that you please give us  2 business days to thoroughly review all your results before contacting the office for clarification. Should we see a critical lab result, you will be contacted sooner.   If You Need Anything After Your Visit  If you  have any questions or concerns for your doctor, please call our main line at [phone] and press option 4 to reach your doctor's medical assistant. If no one answers, please leave a voicemail as directed and we will return your call as soon as possible. Messages left after 4 pm will be answered the following business day.   You may also send us  a message via MyChart. We typically respond to MyChart messages within 1-2 business days.  For prescription refills, please ask your pharmacy to contact our office. Our fax number is [phone].  If you have an urgent issue when the clinic is closed that cannot wait until the next business day, you can page your doctor at the number below.    Please note that while we do our best to be available for urgent issues outside of office hours, we are not  available 24/7.   If you have an urgent issue and are unable to reach us , you may choose to seek medical care at your doctor's office, retail clinic, urgent care center, or emergency room.  If you have a medical emergency, please immediately call 911 or go to the emergency department.  Pager Numbers  - Dr. Hester: [phone]  - Dr. Jackquline: [phone]  - Dr. Claudene: [phone]   - Dr. Raymund: [phone]  In the event of inclement weather, please call our main line at [phone] for an update on the status of any delays or closures.  Dermatology Medication Tips: Please keep the boxes that topical medications come in in order to help keep track of the instructions about where and how to use these. Pharmacies typically print the medication instructions only on the boxes and not directly on the medication tubes.   If your medication is too expensive, please contact our office at [phone] option 4 or send us  a message through MyChart.   We are unable to tell what your co-pay for medications will be in advance as this is different depending on your insurance coverage. However, we may be  able to find a substitute medication at lower cost or fill out paperwork to get insurance to cover a needed medication.   If a prior authorization is required to get your medication covered by your insurance company, please allow us  1-2 business days to complete this process.  Drug prices often vary depending on where the prescription is filled and some pharmacies may offer cheaper prices.  The website www.goodrx.com contains coupons for medications through different pharmacies. The prices here do not account for what the cost may be with help from insurance (it may be cheaper with your insurance), but the website can give you the price if you did not use any insurance.  - You can print the associated coupon and take it with your prescription to the pharmacy.  - You may also stop by our office during regular business hours and pick up a GoodRx coupon card.  - If you need your prescription sent electronically to a different pharmacy, notify our office through [facility] or by phone at [phone] option 4.     Si Usted Necesita Algo Despus de Su Visita  Tambin puede enviarnos un mensaje a travs de Clinical Cytogeneticist. Por lo general respondemos a los mensajes de MyChart en el transcurso de 1 a 2 das hbiles.  Para renovar recetas, por favor pida a su farmacia que se ponga en contacto con nuestra oficina. Randi lakes de fax es [place] [phone].  Si tiene un asunto urgente cuando la clnica est cerrada y que no puede esperar hasta el siguiente da hbil, puede llamar/localizar a su doctor(a) al nmero que aparece a continuacin.   Por favor, tenga en cuenta que aunque hacemos todo lo posible para estar disponibles para asuntos urgentes fuera del horario de [place], no estamos disponibles las 24 horas del da, los 7 [address]  [address] de la [place].   Si tiene un problema urgente y no puede comunicarse con nosotros, puede optar por buscar atencin mdica  en el consultorio de su doctor(a), en una clnica  privada, en un centro de atencin urgente o en una sala de emergencias.  Si tiene engineer, drilling, por favor llame inmediatamente al 911 o vaya a la sala de emergencias.  Nmeros de bper  - Dr. Hester: [phone]  - Dra. Jackquline: [phone]  - Dr. Claudene: [phone]  - Dra. Kitts:  [phone]  En caso de inclemencias del [place], por favor llame a nuestra lnea principal al [phone] para una actualizacin sobre el [place] de cualquier retraso o cierre.  Consejos para la medicacin en dermatologa: Por favor, guarde las cajas en las que vienen los medicamentos de uso tpico para ayudarle a seguir las instrucciones sobre dnde y cmo usarlos. Las farmacias generalmente imprimen las instrucciones del medicamento slo en las cajas y no directamente en los tubos del [place].   Si su medicamento es muy caro, por favor, pngase en contacto con landry rieger llamando al [phone] y presione la opcin 4 o envenos un mensaje a travs de Clinical Cytogeneticist.   No podemos decirle cul ser su copago por los medicamentos por adelantado ya que esto es diferente dependiendo de la cobertura de su seguro. Sin embargo, es posible que podamos encontrar un medicamento sustituto a audiological scientist un formulario para que el seguro cubra el medicamento que se considera necesario.   Si se requiere una autorizacin previa para que su compaa de seguros cubra su medicamento, por favor permtanos de 1 a 2 das hbiles para completar este proceso.  Los precios de los medicamentos varan con frecuencia dependiendo del environmental consultant de dnde se surte la receta y alguna farmacias pueden ofrecer precios ms baratos.  El sitio web www.goodrx.com tiene cupones para medicamentos de health and safety inspector. Los precios aqu no tienen en cuenta lo que podra costar con la ayuda del seguro (puede ser ms barato con su seguro), pero el sitio web puede darle el precio si no utiliz tourist information centre manager.  - Puede imprimir el  cupn correspondiente y llevarlo con su receta a la farmacia.  - Tambin puede pasar por nuestra oficina durante el horario de atencin regular y education officer, museum una tarjeta de cupones de GoodRx.  - Si necesita que su receta se enve electrnicamente a una farmacia diferente, informe a nuestra oficina a travs de MyChart de [place] o por telfono llamando al [phone] y presione la opcin 4.

## 2024-05-05 NOTE — Progress Notes (Signed)
 "  Follow-Up Visit   Subjective  Tina Wallace is a 64 y.o. female who presents for the following: Skin Cancer Screening and Full Body Skin Exam Hx of melanoma  Hx of dysplastic nevi Hx of isks  A spot at right lower leg The patient presents for Total-Body Skin Exam (TBSE) for skin cancer screening and mole check. The patient has spots, moles and lesions to be evaluated, some may be new or changing and the patient may have concern these could be cancer.  The following portions of the chart were reviewed this encounter and updated as appropriate: medications, allergies, medical history  Review of Systems:  No other skin or systemic complaints except as noted in HPI or Assessment and Plan.  Objective  Well appearing patient in no apparent distress; mood and affect are within normal limits.  A full examination was performed including scalp, head, eyes, ears, nose, lips, neck, chest, axillae, abdomen, back, buttocks, bilateral upper extremities, bilateral lower extremities, hands, feet, fingers, toes, fingernails, and toenails. All findings within normal limits unless otherwise noted below.   Relevant physical exam findings are noted in the Assessment and Plan.  face x 1 Erythematous thin papules/macules with gritty scale.  right lower leg lateral x 2, left forearm x 1, left shoulder x 3 (6) Erythematous stuck-on, waxy papule or plaque  Assessment & Plan   HISTORY OF MELANOMA. Left medial breast. 1985. - No evidence of recurrence today - Recommend regular full body skin exams - Recommend daily broad spectrum sunscreen SPF 30+ to sun-exposed areas, reapply every 2 hours as needed.  - Call if any new or changing lesions are noted between office visits    HISTORY OF DYSPLASTIC NEVUS. Right posterior flank. Slight atypia. 10/06/2007. No evidence of recurrence today Recommend regular full body skin exams Recommend daily broad spectrum sunscreen SPF 30+ to sun-exposed areas, reapply  every 2 hours as needed.  Call if any new or changing lesions are noted between office visits     SKIN CANCER SCREENING PERFORMED TODAY.  ACTINIC DAMAGE - Chronic condition, secondary to cumulative UV/sun exposure - diffuse scaly erythematous macules with underlying dyspigmentation - Recommend daily broad spectrum sunscreen SPF 30+ to sun-exposed areas, reapply every 2 hours as needed.  - Staying in the shade or wearing long sleeves, sun glasses (UVA+UVB protection) and wide brim hats (4-inch brim around the entire circumference of the hat) are also recommended for sun protection.  - Call for new or changing lesions.  LENTIGINES, SEBORRHEIC KERATOSES, HEMANGIOMAS - Benign normal skin lesions - Benign-appearing - Call for any changes  MELANOCYTIC NEVI - Tan-brown and/or pink-flesh-colored symmetric macules and papules - Benign appearing on exam today - Observation - Call clinic for new or changing moles - Recommend daily use of broad spectrum spf 30+ sunscreen to sun-exposed areas.   Purpura - Chronic; persistent and recurrent.  Treatable, but not curable. - Violaceous macules and patches - Benign - Related to trauma, age, sun damage and/or use of blood thinners, chronic use of topical and/or oral steroids - Observe - Can use OTC arnica containing moisturizer such as Dermend Bruise Formula if desired - Call for worsening or other concerns   Varicose Veins/Spider Veins - Dilated blue, purple or red veins at the lower extremities - Reassured - Smaller vessels can be treated by sclerotherapy (a procedure to inject a medicine into the veins to make them disappear) if desired, but the treatment is not covered by insurance. Larger vessels may be covered  if symptomatic and we would refer to vascular surgeon if treatment desired.  History of ATOPIC DERMATITIS Exam: clear at exam  Chronic condition with duration or expected duration over one year. Currently well-controlled Atopic  dermatitis (eczema) is a chronic, relapsing, pruritic condition that can significantly affect quality of life. It is often associated with allergic rhinitis and/or asthma and can require treatment with topical medications, phototherapy, or in severe cases biologic injectable medication (Dupixent; Adbry) or Oral JAK inhibitors. Treatment Plan: No recommended treatment today just cerave moisturizing cream   Xerosis Recommend gentle skin care. CeraVe cream ACTINIC KERATOSIS face x 1 Actinic keratoses are precancerous spots that appear secondary to cumulative UV radiation exposure/sun exposure over time. They are chronic with expected duration over 1 year. A portion of actinic keratoses will progress to squamous cell carcinoma of the skin. It is not possible to reliably predict which spots will progress to skin cancer and so treatment is recommended to prevent development of skin cancer.  Recommend daily broad spectrum sunscreen SPF 30+ to sun-exposed areas, reapply every 2 hours as needed.  Recommend staying in the shade or wearing long sleeves, sun glasses (UVA+UVB protection) and wide brim hats (4-inch brim around the entire circumference of the hat). Call for new or changing lesions. - Destruction of lesion - face x 1 Complexity: simple   Destruction method: cryotherapy   Informed consent: discussed and consent obtained   Timeout:  patient name, date of birth, surgical site, and procedure verified Lesion destroyed using liquid nitrogen: Yes   Region frozen until ice ball extended beyond lesion: Yes   Outcome: patient tolerated procedure well with no complications   Post-procedure details: wound care instructions given    INFLAMED SEBORRHEIC KERATOSIS (6) right lower leg lateral x 2, left forearm x 1, left shoulder x 3 (6) Symptomatic, irritating, patient would like treated. - Destruction of lesion - right lower leg lateral x 2, left forearm x 1, left shoulder x 3 (6) Complexity: simple    Destruction method: cryotherapy   Informed consent: discussed and consent obtained   Timeout:  patient name, date of birth, surgical site, and procedure verified Lesion destroyed using liquid nitrogen: Yes   Region frozen until ice ball extended beyond lesion: Yes   Outcome: patient tolerated procedure well with no complications   Post-procedure details: wound care instructions given    SKIN CANCER SCREENING   HISTORY OF MALIGNANT MELANOMA   ACTINIC SKIN DAMAGE   LENTIGO   MELANOCYTIC NEVUS, UNSPECIFIED LOCATION   HISTORY OF DYSPLASTIC NEVUS   PURPURA   OTHER ATOPIC DERMATITIS   COUNSELING AND COORDINATION OF CARE   XEROSIS CUTIS   Return in about 1 year (around 05/05/2025) for TBSE.  IEleanor Blush, CMA, am acting as scribe for Alm Rhyme, MD.   Documentation: I have reviewed the above documentation for accuracy and completeness, and I agree with the above.  Alm Rhyme, MD    "

## 2024-05-09 ENCOUNTER — Ambulatory Visit: Admitting: Cardiovascular Disease

## 2024-05-16 ENCOUNTER — Ambulatory Visit: Admitting: Cardiovascular Disease

## 2025-05-09 ENCOUNTER — Ambulatory Visit: Admitting: Dermatology
